# Patient Record
Sex: Female | Born: 1995 | Hispanic: Yes | Marital: Single | Smoking: Never smoker
Health system: Southern US, Community
[De-identification: ages and names within clinical notes are randomized; demographics above are authoritative.]

## PROBLEM LIST (undated history)

## (undated) ENCOUNTER — Inpatient Hospital Stay: Payer: Self-pay

## (undated) DIAGNOSIS — Z789 Other specified health status: Secondary | ICD-10-CM

## (undated) HISTORY — PX: NO PAST SURGERIES: SHX2092

## (undated) HISTORY — PX: APPENDECTOMY: SHX54

---

## 2017-12-01 NOTE — L&D Delivery Note (Signed)
Delivery Note At 8:15 AM a viable and healthy boy "Kristen Lawrence" was delivered via Vaginal, Spontaneous (Presentation:LOA ).  APGAR: 8, 9; weight  .   Placenta status: spontaneously , .  Cord: 3vC  with the following complications: nuchal x2  Anesthesia:  local Episiotomy: None Lacerations: bilateral periurethral Suture Repair: 3.0 vicryl Est. Blood Loss (mL):  200  Mom to postpartum.  Baby to Couplet care / Skin to Skin.  22yo G2P1001 at 40+2w presenting in active labor. She progressed without augmentation to fully dilated and pushed over an intact perineum. The fetal head delivered and a nuchal cord x2 was doubly clamped and cut on the perineum. The fetal body followed promptly and the baby placed on maternal abdomen, where he was promptly vigorous and crying. The peri-urethral tears were bleeding and repaired with 3-0 Vicryl, using a total of 20ml of 1% lidocaine. Her placenta delivered spontaneously and intact. Fundus firm and midline. Pt tolerated all well.  Christeen DouglasBethany Bernardine Langworthy 06/25/2018, 8:47 AM

## 2018-03-15 ENCOUNTER — Encounter: Payer: Self-pay | Admitting: Family Medicine

## 2018-04-29 ENCOUNTER — Ambulatory Visit: Payer: Self-pay | Admitting: Internal Medicine

## 2018-05-03 ENCOUNTER — Other Ambulatory Visit: Payer: Self-pay | Admitting: Physician Assistant

## 2018-05-03 DIAGNOSIS — O0930 Supervision of pregnancy with insufficient antenatal care, unspecified trimester: Secondary | ICD-10-CM

## 2018-05-14 ENCOUNTER — Encounter: Payer: Self-pay | Admitting: *Deleted

## 2018-05-14 ENCOUNTER — Inpatient Hospital Stay
Admission: RE | Admit: 2018-05-14 | Discharge: 2018-05-14 | Disposition: A | Discharge status: Home / Self Care | Payer: Self-pay | Source: Ambulatory Visit | Attending: Obstetrics and Gynecology | Admitting: Obstetrics and Gynecology

## 2018-05-14 ENCOUNTER — Ambulatory Visit
Admission: RE | Admit: 2018-05-14 | Discharge: 2018-05-14 | Disposition: A | Discharge status: Home / Self Care | Payer: Medicaid Other | Source: Ambulatory Visit | Attending: Physician Assistant | Admitting: Physician Assistant

## 2018-05-14 ENCOUNTER — Other Ambulatory Visit: Payer: Self-pay

## 2018-05-14 DIAGNOSIS — O0933 Supervision of pregnancy with insufficient antenatal care, third trimester: Secondary | ICD-10-CM | POA: Insufficient documentation

## 2018-05-14 DIAGNOSIS — O4103X9 Oligohydramnios, third trimester, other fetus: Secondary | ICD-10-CM

## 2018-05-14 DIAGNOSIS — Z3A34 34 weeks gestation of pregnancy: Secondary | ICD-10-CM | POA: Diagnosis not present

## 2018-05-14 DIAGNOSIS — O0930 Supervision of pregnancy with insufficient antenatal care, unspecified trimester: Secondary | ICD-10-CM | POA: Diagnosis present

## 2018-05-14 DIAGNOSIS — O4103X0 Oligohydramnios, third trimester, not applicable or unspecified: Secondary | ICD-10-CM | POA: Diagnosis not present

## 2018-05-14 HISTORY — DX: Other specified health status: Z78.9

## 2018-05-14 NOTE — OB Triage Note (Signed)
Low AFI per US this evening. Kristen Lawrence, Kristen Lawrence

## 2018-05-14 NOTE — Discharge Instructions (Signed)
Please call the office at [facility] to schedule a repeat ultrasound to check your baby's fluid on Tuesday. Our phone number is [phone]. If you would rather, you can return to the hospital to get an ultrasound. You will need to call [facility] to ask them to order the ultrasound on Monday. Thank you!  Llame a la oficina de [facility] para programar una repeticin de ultrasonido para verificar el lquido de su beb el [place]. Nuestro nmero de telfono es [phone]. Si lo prefiere, puede regresar al hospital para obtener un ultrasonido. Deber llamar a [facility] para pedirles que ordenen el ultrasonido el lunes. Gracias!    Evaluacin de los movimientos fetales Fetal Movement Counts Introduccin Nombre del paciente: ________________________________________________ Kristen ChapmanFecha de parto estimada: ____________________ Young BerryQu es una evaluacin de los movimientos fetales? Una evaluacin de los movimientos fetales es el registro del nmero de veces que siente que el beb se mueve durante un cierto perodo de Pasadenatiempo. Esto tambin se puede denominar recuento de patadas fetales. Una evaluacin de movimientos fetales se recomienda a todas las embarazadas. Es posible que le indiquen que comience a Development worker, communityevaluar los movimientos fetales desde la semana 28 de Joslinembarazo. Preste atencin cuando sienta que el beb est ms activo. Podr detectar los ciclos en que el beb duerme y est despierto. Tambin podr detectar que ciertas cosas hacen que su beb se mueva ms. Deber realizar una evaluacin de los movimientos fetales en las siguientes situaciones:  Cuando el beb est ms activo habitualmente.  A la Unisys Corporationmisma hora, todos los Hawleydas.  Un buen momento para evaluar los movimientos fetales es cuando est descansando, despus de haber comido y bebido algo. Cmo debo contar los movimientos fetales? 1. Encuentre un lugar tranquilo y cmodo. Sintese o acustese de lado. 2. Anote la fecha, la hora de inicio y  de finalizacin y la cantidad de movimientos que sinti entre esas dos horas. Lleve esta informacin a las visitas de control. 3. Cuente las pataditas, revoloteos, chasquidos, vueltas o pinchazos en un perodo de 2horas. Debe sentir al menos 10movimientos en 2horas. 4. Cuando sienta 10movimientos, puede dejar de contar. 5. Si no siente 10movimientos en 2horas, coma y beba algo. Luego, contine descansando y Industrial/product designercontando durante 1hora. Si siente al menos 4movimientos durante esa hora, puede dejar de contar. Comunquese con un mdico si:  Siente menos de 4movimientos en 2horas.  El beb no se mueve tanto como suele hacerlo. Fecha: ____________ Stevan BornHora de inicio: ____________ Stevan BornHora de finalizacin: ____________ Movimientos: ____________ Franco NonesFecha: ____________ Stevan BornHora de inicio: ____________ Stevan BornHora de finalizacin: ____________ Movimientos: ____________ Franco NonesFecha: ____________ Stevan BornHora de inicio: ____________ Stevan BornHora de finalizacin: ____________ Movimientos: ____________ Franco NonesFecha: ____________ Stevan BornHora de inicio: ____________ Stevan BornHora de finalizacin: ____________ Movimientos: ____________ Franco NonesFecha: ____________ Stevan BornHora de inicio: ____________ Mammie RussianHora de finalizacin: ____________ Movimientos: ____________ Franco NonesFecha: ____________ Stevan BornHora de inicio: ____________ Mammie RussianHora de finalizacin: ____________ Movimientos: ____________ Franco NonesFecha: ____________ Stevan BornHora de inicio: ____________ Mammie RussianHora de finalizacin: ____________ Movimientos: ____________ Franco NonesFecha: ____________ Stevan BornHora de inicio: ____________ Stevan BornHora de finalizacin: ____________ Movimientos: ____________ Franco NonesFecha: ____________ Stevan BornHora de inicio: ____________ Mammie RussianHora de finalizacin: ____________ Movimientos: ____________ Esta informacin no tiene como fin reemplazar el consejo del mdico. Asegrese de hacerle al mdico cualquier pregunta que tenga. Document Released: 02/24/2008 Document Revised: 02/20/2017 Document Reviewed: 12/27/2015 Elsevier Interactive Patient Education  [employer]2018 Elsevier Inc.

## 2018-05-14 NOTE — Final Progress Note (Signed)
No evidence of PPROM, reassuring fetal movements and NST. D/c home with precautions and close followup

## 2018-05-14 NOTE — Progress Notes (Signed)
TRIAGE VISIT with NST   [name] is a 22 y.o. G2P1001. She is at 4874w2d gestation. Pt of [facility], no records available. Per patient, she was sent for an ultrasound to the hospital.  Indication: Incidental finding of low fluid on ultrasound on anatomy ultrasound today at 34wks  Pt arrived from [place] and [facility]. She does not have records with her, but states an u/s was normal earlier in the pregnancy, without any pregnancy concerns. U/s today as outpatient, but AFI of 6.9 led ultrasound to send her to triage for monitoring. Labs normal with Rh positive per verbal from outpatient provider.  No leaking fluid. Good fetal movement. No contractions.  S: Resting comfortably. no CTX, no VB. Active fetal movement.  O:  BP 119/83   Pulse 75   Temp 98.8 F (37.1 C) (Oral)   Resp 16   Ht 5\' 6"  (1.676 m)   Wt 79.8 kg (176 lb)   BMI 28.41 kg/m  No results found for this or any previous visit (from the past 48 hour(s)).   Gen: NAD, AAOx3      Abd: FNTTP      Ext: Non-tender, Nonedmeatous    FHT: 135. Mod var. +accels 15x15, no decels TOCO: quiet SVE:   no fluid in the vaginal vault   A/P:  22 y.o. G2P1001 8574w2d with low AFI.   Labor: not present.   Rule out PPROM: Neg for pooling, ferning and nitrazine.  Fetal Wellbeing: NST is Reassuring Cat 1 tracing.  D/c home stable, precautions reviewed, follow-up as for repeat ultrasound in 5 days. Fetal movement counts recommended. See patient instructions.  Patient ID: [name], female   DOB: [birth year], 22 y.o.   MRN: [id number]

## 2018-05-19 ENCOUNTER — Other Ambulatory Visit: Payer: Self-pay | Admitting: Physician Assistant

## 2018-05-20 ENCOUNTER — Other Ambulatory Visit: Payer: Self-pay | Admitting: Physician Assistant

## 2018-05-20 DIAGNOSIS — Z3689 Encounter for other specified antenatal screening: Secondary | ICD-10-CM

## 2018-05-28 ENCOUNTER — Other Ambulatory Visit: Payer: Self-pay | Admitting: Physician Assistant

## 2018-05-28 DIAGNOSIS — Z3689 Encounter for other specified antenatal screening: Secondary | ICD-10-CM

## 2018-05-31 ENCOUNTER — Other Ambulatory Visit: Payer: Self-pay | Admitting: Physician Assistant

## 2018-05-31 ENCOUNTER — Ambulatory Visit
Admission: RE | Admit: 2018-05-31 | Discharge: 2018-05-31 | Disposition: A | Discharge status: Home / Self Care | Payer: Self-pay | Source: Ambulatory Visit | Attending: Physician Assistant | Admitting: Physician Assistant

## 2018-05-31 DIAGNOSIS — O288 Other abnormal findings on antenatal screening of mother: Secondary | ICD-10-CM | POA: Insufficient documentation

## 2018-05-31 DIAGNOSIS — Z3A35 35 weeks gestation of pregnancy: Secondary | ICD-10-CM | POA: Insufficient documentation

## 2018-05-31 DIAGNOSIS — O4103X0 Oligohydramnios, third trimester, not applicable or unspecified: Secondary | ICD-10-CM | POA: Insufficient documentation

## 2018-06-07 ENCOUNTER — Other Ambulatory Visit: Payer: Self-pay | Admitting: Physician Assistant

## 2018-06-07 ENCOUNTER — Ambulatory Visit: Payer: Self-pay

## 2018-06-07 DIAGNOSIS — Z3493 Encounter for supervision of normal pregnancy, unspecified, third trimester: Secondary | ICD-10-CM

## 2018-06-07 DIAGNOSIS — O4100X0 Oligohydramnios, unspecified trimester, not applicable or unspecified: Secondary | ICD-10-CM

## 2018-06-09 ENCOUNTER — Ambulatory Visit
Admission: RE | Admit: 2018-06-09 | Discharge: 2018-06-09 | Disposition: A | Discharge status: Home / Self Care | Payer: Self-pay | Source: Ambulatory Visit | Attending: Physician Assistant | Admitting: Physician Assistant

## 2018-06-09 DIAGNOSIS — O4100X0 Oligohydramnios, unspecified trimester, not applicable or unspecified: Secondary | ICD-10-CM

## 2018-06-09 DIAGNOSIS — O4103X0 Oligohydramnios, third trimester, not applicable or unspecified: Secondary | ICD-10-CM | POA: Insufficient documentation

## 2018-06-09 DIAGNOSIS — Z3A37 37 weeks gestation of pregnancy: Secondary | ICD-10-CM | POA: Insufficient documentation

## 2018-06-09 DIAGNOSIS — Z3493 Encounter for supervision of normal pregnancy, unspecified, third trimester: Secondary | ICD-10-CM

## 2018-06-10 ENCOUNTER — Other Ambulatory Visit: Payer: Self-pay | Admitting: Family Medicine

## 2018-06-10 DIAGNOSIS — O4100X0 Oligohydramnios, unspecified trimester, not applicable or unspecified: Secondary | ICD-10-CM

## 2018-06-16 ENCOUNTER — Other Ambulatory Visit: Payer: Self-pay | Admitting: Family Medicine

## 2018-06-16 ENCOUNTER — Ambulatory Visit
Admission: RE | Admit: 2018-06-16 | Discharge: 2018-06-16 | Disposition: A | Discharge status: Home / Self Care | Payer: Self-pay | Source: Ambulatory Visit | Attending: Family Medicine | Admitting: Family Medicine

## 2018-06-16 DIAGNOSIS — O288 Other abnormal findings on antenatal screening of mother: Secondary | ICD-10-CM

## 2018-06-16 DIAGNOSIS — O4100X0 Oligohydramnios, unspecified trimester, not applicable or unspecified: Secondary | ICD-10-CM

## 2018-06-16 DIAGNOSIS — O4103X0 Oligohydramnios, third trimester, not applicable or unspecified: Secondary | ICD-10-CM | POA: Insufficient documentation

## 2018-06-22 ENCOUNTER — Other Ambulatory Visit: Payer: Self-pay | Admitting: Physician Assistant

## 2018-06-22 DIAGNOSIS — O4193X0 Disorder of amniotic fluid and membranes, unspecified, third trimester, not applicable or unspecified: Secondary | ICD-10-CM

## 2018-06-25 ENCOUNTER — Other Ambulatory Visit: Payer: Self-pay

## 2018-06-25 ENCOUNTER — Ambulatory Visit
Admission: RE | Admit: 2018-06-25 | Discharge: 2018-06-25 | Disposition: A | Discharge status: Home / Self Care | Payer: Self-pay | Source: Ambulatory Visit | Attending: Physician Assistant | Admitting: Physician Assistant

## 2018-06-25 ENCOUNTER — Inpatient Hospital Stay
Admission: EM | Admit: 2018-06-25 | Discharge: 2018-06-26 | DRG: 806 | Disposition: A | Discharge status: Home / Self Care | Payer: Medicaid Other | Attending: Obstetrics and Gynecology | Admitting: Obstetrics and Gynecology

## 2018-06-25 ENCOUNTER — Encounter: Payer: Self-pay | Admitting: Certified Nurse Midwife

## 2018-06-25 DIAGNOSIS — O99824 Streptococcus B carrier state complicating childbirth: Secondary | ICD-10-CM | POA: Diagnosis present

## 2018-06-25 DIAGNOSIS — Z3A40 40 weeks gestation of pregnancy: Secondary | ICD-10-CM | POA: Diagnosis not present

## 2018-06-25 DIAGNOSIS — Z6791 Unspecified blood type, Rh negative: Secondary | ICD-10-CM | POA: Diagnosis not present

## 2018-06-25 DIAGNOSIS — O4103X0 Oligohydramnios, third trimester, not applicable or unspecified: Secondary | ICD-10-CM | POA: Diagnosis present

## 2018-06-25 DIAGNOSIS — Z3483 Encounter for supervision of other normal pregnancy, third trimester: Secondary | ICD-10-CM | POA: Diagnosis present

## 2018-06-25 DIAGNOSIS — O26893 Other specified pregnancy related conditions, third trimester: Secondary | ICD-10-CM | POA: Diagnosis present

## 2018-06-25 DIAGNOSIS — Z3490 Encounter for supervision of normal pregnancy, unspecified, unspecified trimester: Secondary | ICD-10-CM

## 2018-06-25 LAB — TYPE AND SCREEN
ABO/RH(D): O NEG
Antibody Screen: POSITIVE
UNIT DIVISION: 0
Unit division: 0

## 2018-06-25 LAB — CBC
HCT: 39.2 % (ref 35.0–47.0)
Hemoglobin: 13.2 g/dL (ref 12.0–16.0)
MCH: 32 pg (ref 26.0–34.0)
MCHC: 33.7 g/dL (ref 32.0–36.0)
MCV: 94.9 fL (ref 80.0–100.0)
PLATELETS: 129 10*3/uL — AB (ref 150–440)
RBC: 4.13 MIL/uL (ref 3.80–5.20)
RDW: 14 % (ref 11.5–14.5)
WBC: 12.9 10*3/uL — AB (ref 3.6–11.0)

## 2018-06-25 LAB — BPAM RBC
Blood Product Expiration Date: 201908122359
Blood Product Expiration Date: 201908122359
UNIT TYPE AND RH: 9500
Unit Type and Rh: 9500

## 2018-06-25 LAB — PROTEIN / CREATININE RATIO, URINE
Creatinine, Urine: 51 mg/dL
PROTEIN CREATININE RATIO: 1.14 mg/mg{creat} — AB (ref 0.00–0.15)
Total Protein, Urine: 58 mg/dL

## 2018-06-25 LAB — COMPREHENSIVE METABOLIC PANEL
ALT: 11 U/L (ref 0–44)
AST: 18 U/L (ref 15–41)
Albumin: 3.2 g/dL — ABNORMAL LOW (ref 3.5–5.0)
Alkaline Phosphatase: 196 U/L — ABNORMAL HIGH (ref 38–126)
Anion gap: 9 (ref 5–15)
BUN: 11 mg/dL (ref 6–20)
CO2: 20 mmol/L — ABNORMAL LOW (ref 22–32)
Calcium: 9.1 mg/dL (ref 8.9–10.3)
Chloride: 110 mmol/L (ref 98–111)
Creatinine, Ser: 0.62 mg/dL (ref 0.44–1.00)
GFR calc Af Amer: >60 mL/min (ref >60)
GFR calc non Af Amer: >60 mL/min (ref >60)
GLUCOSE: 76 mg/dL (ref 70–99)
Potassium: 3.9 mmol/L (ref 3.5–5.1)
Sodium: 139 mmol/L (ref 135–145)
Total Bilirubin: 0.6 mg/dL (ref 0.3–1.2)
Total Protein: 6.7 g/dL (ref 6.5–8.1)

## 2018-06-25 MED ORDER — PRENATAL MULTIVITAMIN CH
1.0000 | ORAL_TABLET | Freq: Every day | ORAL | Status: DC
  Administered 2018-06-26: 1 via ORAL
  Filled 2018-06-25: qty 1

## 2018-06-25 MED ORDER — SODIUM CHLORIDE 0.9% FLUSH: 3.0000 mL | INTRAVENOUS | Status: DC | PRN

## 2018-06-25 MED ORDER — FLEET ENEMA 7-19 GM/118ML RE ENEM: 1.0000 | ENEMA | Freq: Every day | RECTAL | Status: DC | PRN

## 2018-06-25 MED ORDER — OXYTOCIN 10 UNIT/ML IJ SOLN
INTRAMUSCULAR | Status: AC
  Filled 2018-06-25: qty 2

## 2018-06-25 MED ORDER — SOD CITRATE-CITRIC ACID 500-334 MG/5ML PO SOLN: 30.0000 mL | ORAL | Status: DC | PRN

## 2018-06-25 MED ORDER — BUTORPHANOL TARTRATE 1 MG/ML IJ SOLN
1.0000 mg | INTRAMUSCULAR | Status: DC | PRN
  Administered 2018-06-25: 1 mg via INTRAVENOUS

## 2018-06-25 MED ORDER — IBUPROFEN 600 MG PO TABS
600.0000 mg | ORAL_TABLET | Freq: Four times a day (QID) | ORAL | Status: DC
  Administered 2018-06-25 – 2018-06-26 (×3): 600 mg via ORAL
  Filled 2018-06-25 (×4): qty 1

## 2018-06-25 MED ORDER — ACETAMINOPHEN 325 MG PO TABS
650.0000 mg | ORAL_TABLET | ORAL | Status: DC | PRN
Start: 2018-06-25 — End: 2018-06-26
  Administered 2018-06-26: 650 mg via ORAL
  Filled 2018-06-25: qty 2

## 2018-06-25 MED ORDER — LIDOCAINE HCL (PF) 1 % IJ SOLN
INTRAMUSCULAR | Status: AC
  Filled 2018-06-25: qty 30

## 2018-06-25 MED ORDER — SIMETHICONE 80 MG PO CHEW
80.0000 mg | CHEWABLE_TABLET | ORAL | Status: DC | PRN
Start: 2018-06-25 — End: 2018-06-26

## 2018-06-25 MED ORDER — BISACODYL 10 MG RE SUPP: 10.0000 mg | Freq: Every day | RECTAL | Status: DC | PRN

## 2018-06-25 MED ORDER — COCONUT OIL OIL
1.0000 | TOPICAL_OIL | Status: DC | PRN
Start: 2018-06-25 — End: 2018-06-26

## 2018-06-25 MED ORDER — ONDANSETRON HCL 4 MG PO TABS: 4.0000 mg | ORAL_TABLET | ORAL | Status: DC | PRN

## 2018-06-25 MED ORDER — SODIUM CHLORIDE 0.9 % IV SOLN: 250.0000 mL | INTRAVENOUS | Status: DC | PRN

## 2018-06-25 MED ORDER — AMMONIA AROMATIC IN INHA
RESPIRATORY_TRACT | Status: AC
  Filled 2018-06-25: qty 10

## 2018-06-25 MED ORDER — ONDANSETRON HCL 4 MG/2ML IJ SOLN: 4.0000 mg | INTRAMUSCULAR | Status: DC | PRN

## 2018-06-25 MED ORDER — LIDOCAINE HCL (PF) 1 % IJ SOLN
30.0000 mL | INTRAMUSCULAR | Status: DC | PRN
  Administered 2018-06-25: 30 mL via SUBCUTANEOUS

## 2018-06-25 MED ORDER — SODIUM CHLORIDE 0.9% FLUSH
3.0000 mL | Freq: Two times a day (BID) | INTRAVENOUS | Status: DC
  Administered 2018-06-26: 3 mL via INTRAVENOUS

## 2018-06-25 MED ORDER — SODIUM CHLORIDE 0.9 % IV SOLN
2.0000 g | Freq: Once | INTRAVENOUS | Status: AC
  Administered 2018-06-25: 2 g via INTRAVENOUS
  Filled 2018-06-25: qty 2000

## 2018-06-25 MED ORDER — BENZOCAINE-MENTHOL 20-0.5 % EX AERO: 1.0000 "application " | INHALATION_SPRAY | CUTANEOUS | Status: DC | PRN

## 2018-06-25 MED ORDER — MEASLES, MUMPS & RUBELLA VAC ~~LOC~~ INJ
0.5000 mL | INJECTION | Freq: Once | SUBCUTANEOUS | Status: DC
  Filled 2018-06-25: qty 0.5

## 2018-06-25 MED ORDER — OXYTOCIN 40 UNITS IN LACTATED RINGERS INFUSION - SIMPLE MED
2.5000 [IU]/h | INTRAVENOUS | Status: DC
  Administered 2018-06-25: 2.5 [IU]/h via INTRAVENOUS
  Filled 2018-06-25: qty 1000

## 2018-06-25 MED ORDER — DIBUCAINE 1 % RE OINT: 1.0000 "application " | TOPICAL_OINTMENT | RECTAL | Status: DC | PRN

## 2018-06-25 MED ORDER — SENNOSIDES-DOCUSATE SODIUM 8.6-50 MG PO TABS
2.0000 | ORAL_TABLET | ORAL | Status: DC
  Administered 2018-06-25: 2 via ORAL
  Filled 2018-06-25: qty 2

## 2018-06-25 MED ORDER — SODIUM CHLORIDE 0.9 % IV SOLN
1.0000 g | INTRAVENOUS | Status: DC
  Filled 2018-06-25 (×6): qty 1000

## 2018-06-25 MED ORDER — ACETAMINOPHEN 325 MG PO TABS: 650.0000 mg | ORAL_TABLET | ORAL | Status: DC | PRN

## 2018-06-25 MED ORDER — TETANUS-DIPHTH-ACELL PERTUSSIS 5-2.5-18.5 LF-MCG/0.5 IM SUSP: 0.5000 mL | Freq: Once | INTRAMUSCULAR | Status: DC

## 2018-06-25 MED ORDER — DIPHENHYDRAMINE HCL 25 MG PO CAPS: 25.0000 mg | ORAL_CAPSULE | Freq: Four times a day (QID) | ORAL | Status: DC | PRN

## 2018-06-25 MED ORDER — WITCH HAZEL-GLYCERIN EX PADS: 1.0000 "application " | MEDICATED_PAD | CUTANEOUS | Status: DC | PRN

## 2018-06-25 MED ORDER — OXYTOCIN BOLUS FROM INFUSION
500.0000 mL | Freq: Once | INTRAVENOUS | Status: AC
  Administered 2018-06-25: 500 mL via INTRAVENOUS

## 2018-06-25 MED ORDER — OXYCODONE HCL 5 MG PO TABS: 5.0000 mg | ORAL_TABLET | ORAL | Status: DC | PRN

## 2018-06-25 MED ORDER — LACTATED RINGERS IV SOLN
INTRAVENOUS | Status: DC
  Administered 2018-06-25 (×2): via INTRAVENOUS

## 2018-06-25 MED ORDER — ONDANSETRON HCL 4 MG/2ML IJ SOLN: 4.0000 mg | Freq: Four times a day (QID) | INTRAMUSCULAR | Status: DC | PRN

## 2018-06-25 MED ORDER — ZOLPIDEM TARTRATE 5 MG PO TABS: 5.0000 mg | ORAL_TABLET | Freq: Every evening | ORAL | Status: DC | PRN

## 2018-06-25 MED ORDER — BUTORPHANOL TARTRATE 2 MG/ML IJ SOLN
INTRAMUSCULAR | Status: AC
Start: 2018-06-25 — End: 2018-06-25
  Administered 2018-06-25: 1 mg via INTRAVENOUS
  Filled 2018-06-25: qty 1

## 2018-06-25 MED ORDER — LACTATED RINGERS IV SOLN: 500.0000 mL | INTRAVENOUS | Status: DC | PRN

## 2018-06-25 NOTE — H&P (Signed)
OB ADMISSION/ HISTORY & PHYSICAL:  Admission Date: 06/25/2018 12:11 AM  Admit Diagnosis: Active labor  Kristen Lawrence is a 22 y.o. female presenting for active labor.  Prenatal History: G2P1001   EDC : 06/23/2018, by Ultrasound  Prenatal care at [facility] Prenatal course complicated by  - GBS pos Rh neg Incomplete prenatal care [place] in this pregnancy, normal fetal surveillance  Medical / Surgical History :  Past medical history:  Past Medical History:  Diagnosis Date  . Medical history non-contributory      Past surgical history:  Past Surgical History:  Procedure Laterality Date  . NO PAST SURGERIES      Family History: History reviewed. No pertinent family history.   Social History:  reports that she has never smoked. She has never used smokeless tobacco. She reports that she does not drink alcohol or use drugs.   Allergies: Patient has no known allergies.    Current Medications at time of admission:  Prior to Admission medications   Medication Sig Start Date End Date Taking? Authorizing Provider  Prenatal Vit-Fe Fumarate-FA (PRENATAL MULTIVITAMIN) TABS tablet Take 1 tablet by mouth daily at 12 noon.   Yes [provider]     Review of Systems: Active FM LOF  / SROM: no bloody show yes   Physical Exam:  VS: Blood pressure 140/81, pulse 75, resp. rate 18, height 5\' 6"  (1.676 m), weight 84.4 kg (186 lb).  General: alert and oriented, appears NAD Heart: RRR Lungs: Clear lung fields Abdomen: Gravid, soft and non-tender, non-distended / uterus: non tender Extremities: trace edema  FHT: 120, moderate variability, +accels, no decels TOCO:q2-3 min SVE:  Dilation: 6.5 / Effacement (%): 80, 90 / Station: -2    Cephalic by leopolds  Prenatal Labs: Blood type/Rh --/--/PENDING (07/26 0414)  Antibody screen neg  Rubella Immune  Varicella Immune  RPR NR  HBsAg Neg  HIV NR  GC neg  Chlamydia neg  Genetic screening negative  1  hour GTT 83  3 hour GTT n/a  GBS positive   US Ob Limited  Result Date: 06/17/2018 CLINICAL DATA:  Third trimester pregnancy with oligohydramnios. EXAM: LIMITED OBSTETRIC ULTRASOUND COMPARISON:  06/09/2018 FINDINGS: Number of Fetuses: 1 Heart Rate:  153 bpm Movement: Yes Presentation: Cephalic Placental Location: Posterior Previa: No Amniotic Fluid (Subjective):  Borderline decreased AFI: 7.6 cm MATERNAL FINDINGS: Cervix:  Appears closed. IMPRESSION: Borderline decreased amniotic fluid volume, with AFI measuring 7.6 cm. No significant change noted compared to prior study. This exam is performed on an emergent basis and does not comprehensively evaluate fetal size, dating, or anatomy; follow-up complete OB US should be considered if further fetal assessment is warranted. Electronically Signed   By: Myles Rosenthal M.D.   On: 06/17/2018 08:09   US Ob Limited  Result Date: 06/10/2018 CLINICAL DATA:  Followup oligohydramnios. Third trimester pregnancy. EXAM: LIMITED OBSTETRIC ULTRASOUND COMPARISON: 05/31/2018 FINDINGS: Number of Fetuses: 1 Heart Rate:  143 bpm Movement: Yes Presentation: Cephalic Placental Location: Posterior Previa: No Amniotic Fluid (Subjective): Borderline decreased, without significant change subjectively compared to previous study AFI: 7.4 cm (5th %ile = 7.3 cm for 38 weeks) BPD: 9.1 cm 37 w  0 d MATERNAL FINDINGS: Cervix:  Not evaluated (>34 wks) Uterus/Adnexae: No abnormality visualized. IMPRESSION: Single living intrauterine fetus in cephalic presentation. Borderline decreased amniotic fluid volume, with AFI of 7.4 cm, without significant change since prior study on 05/31/2018. Electronically Signed   By: Myles Rosenthal M.D.   On: 06/10/2018  08:17   [place] Ob Limited  Result Date: 06/01/2018 CLINICAL DATA:  Oligohydramnios. EXAM: LIMITED OBSTETRIC ULTRASOUND FINDINGS: Number of Fetuses: 1 Heart Rate:  144 bpm Movement: Present Presentation: Cephalic Placental Location: Posterior Previa: None  Amniotic Fluid (Subjective):  Decreased AFI: 6.2 cm which is below the fifth percentile. BPD: 8.8 cm 35 w  5 d MATERNAL FINDINGS: Cervix:  Appears closed. Uterus/Adnexae: No abnormality visualized. IMPRESSION: Oligohydramnios with an amniotic fluid index of 6.2 cm which is down from 6.9 cm on the study of May 14, 2018. These values are below the fifth percentile. Viable IUP with estimated age of 35 weeks 5 days This exam is performed on an emergent basis and does not comprehensively evaluate fetal size, dating, or anatomy; follow-up complete OB US should be considered if further fetal assessment is warranted. Electronically Signed   By: David  SwazilandJordan M.D.   On: 06/01/2018 07:58    Assessment: 40+[redacted] weeks gestation 1 stage of labor FHR category 1 Earlier prenatal care in [place], prior baby born there as well.  Plan:   Admit for active labor Labs pending Epidural when desired Continuous fetal monitoring   1. Fetal Well being  - Fetal Tracing: Cat I  - Ultrasound:  reviewed, as above - Group B Streptococcus: positive- start amp - Presentation: vtx confirmed by Leopolds   2. Routine OB: - Prenatal labs reviewed, as above - Rh O neg  3. Post Partum Planning: - Infant feeding: breast - Contraception: undecided

## 2018-06-25 NOTE — Lactation Note (Signed)
This note was copied from a baby's chart. Lactation Consultation Note  Patient Name: Boy Ritta SlotSara Munoz Morales Today's Date: 06/25/2018 Reason for consult: Term;Initial assessment Assisted mom with latching Freida BusmanAllen to right breast after mom had just breast fed on left breast.  Mom has firm flattish nipples, but can compress to get him to latch. Can express drops of colostrum. Once he feels breast deep enough in his mouth, he pulls it in and begins good rhythmic sucking with occasional swallows.  Mom reports through interpreter that she had same problem with getting good latch with 22 year old, but once she figured it out she went on to breast feed for 1 year but did give some bottles of formula also.  Discussed supply and demand, normal course of lactation and routine newborn feeding patterns.  Lactation name and number were written on white board earlier and encouraged to call with any questions, concerns or assistance.  Maternal Data Formula Feeding for Exclusion: No Has patient been taught Hand Expression?: Yes Does the patient have breastfeeding experience prior to this delivery?: Yes  Feeding Feeding Type: Breast Fed Length of feed: 8 min  LATCH Score Latch: Repeated attempts needed to sustain latch, nipple held in mouth throughout feeding, stimulation needed to elicit sucking reflex.  Audible Swallowing: A few with stimulation  Type of Nipple: Flat  Comfort (Breast/Nipple): Soft / non-tender  Hold (Positioning): Assistance needed to correctly position infant at breast and maintain latch.  LATCH Score: 6  Interventions Interventions: Breast feeding basics reviewed;Reverse pressure;Assisted with latch;Breast compression;Adjust position;Breast massage;Support pillows;Position options  Lactation Tools Discussed/Used WIC Program: Yes   Consult Status Consult Status: PRN    Louis MeckelWilliams, Kristion Holifield Kay 06/25/2018, 8:13 PM

## 2018-06-25 NOTE — Discharge Summary (Addendum)
Obstetrical Discharge Summary  Patient Name: Kristen Lawrence DOB: 1996/11/13 MRN: [id number]  Date of Admission: 06/25/2018 Date of Discharge: 06/26/2018  [facility]   Gestational Age at Delivery: 2223w2d   Antepartum complications:  GBS pos Rh neg Incomplete prenatal care [place] in this pregnancy, normal fetal surveillance   Admitting Diagnosis: Active labor Secondary Diagnosis:Rh neg Patient Active Problem List   Diagnosis Date Noted  . Pregnancy 06/25/2018    Augmentation: none Complications: None Intrapartum complications/course:  22yo G2P1001 at 40+2w presenting in active labor. She progressed without augmentation to fully dilated and pushed over an intact perineum. The fetal head delivered and a nuchal cord x2 was doubly clamped and cut on the perineum. The fetal body followed promptly and the baby placed on maternal abdomen, where he was promptly vigorous and crying. The peri-urethral tears were bleeding and repaired with 3-0 Vicryl, using a total of 20ml of 1% lidocaine. Her placenta delivered spontaneously and intact. Fundus firm and midline. Pt tolerated all well.  Date of Delivery: 06/25/18 Delivered By: Christeen DouglasBethany Beasley  Delivery Type: spontaneous vaginal delivery Anesthesia: local Placenta: Spontaneous Laceration: periurethral Episiotomy: none Newborn Data: Live born boy "Freida Busmanllen" Birth Weight:  3330g 7lb 5.5oz APGAR: 8, 9  Newborn Delivery   Birth date/time:  06/25/2018 08:15:00 Delivery type:  Vaginal, Spontaneous     Discharge Physical Exam:  BP (!) 132/95 (BP Location: Left Arm) Comment: nurse Maureen RalphsSusan Hedrick notified  Pulse 60   Temp 98.8 F (37.1 C) (Oral)   Resp 18   Ht 5\' 6"  (1.676 m)   Wt 84.4 kg (186 lb)   SpO2 100%   BMI 30.02 kg/m   General: NAD CV: RRR Pulm: CTABL, nl effort ABD: s/nd/nt, fundus firm and below the umbilicus Lochia: moderate DVT Evaluation: LE non-ttp, no evidence of DVT on exam.  Hemoglobin   Date Value Ref Range Status  06/26/2018 11.0 (L) 12.0 - 16.0 g/dL Final   HCT  Date Value Ref Range Status  06/26/2018 32.5 (L) 35.0 - 47.0 % Final    Post partum course: Patient had an uncomplicated postpartum course.  By time of discharge on PPD#1, her pain was controlled on oral pain medications; she had appropriate lochia and was ambulating, voiding without difficulty and tolerating regular diet. She had intermittent mildly elevated BPs, but normal labs and no other signs or symptoms of preeclampsia. She was deemed stable for discharge to home.  Postpartum Procedures: none Disposition: stable, discharge to home Baby Feeding: breastmilk Baby Disposition: home with mom  Rh Immune globulin given: ordered to be given prior to discharge Rubella vaccine given: n/a Tdap vaccine given in AP or PP setting: 04/30/2018 Flu vaccine given in AP or PP setting: n/a  Contraception: undecided, thinking about IUD  Prenatal Labs: Blood type/Rh --/--/O NEG (07/26 0509)  Antibody screen neg  Rubella Immune  Varicella Immune  RPR NR  HBsAg Neg  HIV NR  GC neg  Chlamydia neg  Genetic screening negative  1 hour GTT 83  3 hour GTT n/a  GBS positive    Plan:  Kristen Lawrence was discharged to home in good condition. [facility] with delivering provider in 6 weeks. Follow-up for blood pressure check in 1 week.    Discharge Medications: Allergies as of 06/26/2018   No Known Allergies     Medication List    TAKE these medications   acetaminophen 325 MG tablet Commonly known as:  TYLENOL Take  2 tablets (650 mg total) by mouth every 4 (four) hours as needed for mild pain or moderate pain.   ibuprofen 600 MG tablet Commonly known as:  ADVIL,MOTRIN Take 1 tablet (600 mg total) by mouth every 6 (six) hours as needed for mild pain, moderate pain or cramping.   prenatal multivitamin Tabs tablet Take 1 tablet by mouth daily at 12 noon.       Follow-up Information    Christeen Douglas, MD. Schedule an appointment as soon as possible for a visit in 6 week(s).   Specialty:  Obstetrics and Gynecology Why:  For routine postpartum visit Contact information: [address] [place] [address] [phone]        [facility]. Schedule an appointment as soon as possible for a visit in 1 week(s).   Why:  For blood pressure check Contact information: [address] [place] [address] [phone]          Signed: Genia Del, CNM 06/26/2018 9:53 AM

## 2018-06-25 NOTE — OB Triage Note (Signed)
Pt states she has been having ctxs since 5AM. Pt states the ctxs have been 3-5 minutes apart since 10PM. Pt states she has had some bloody show. Pt denies LOF and states + FM.

## 2018-06-26 ENCOUNTER — Encounter: Payer: Self-pay | Admitting: Lactation Services

## 2018-06-26 LAB — CBC
HEMATOCRIT: 32.5 % — AB (ref 35.0–47.0)
Hemoglobin: 11 g/dL — ABNORMAL LOW (ref 12.0–16.0)
MCH: 32.6 pg (ref 26.0–34.0)
MCHC: 34 g/dL (ref 32.0–36.0)
MCV: 95.7 fL (ref 80.0–100.0)
Platelets: 113 10*3/uL — ABNORMAL LOW (ref 150–440)
RBC: 3.39 MIL/uL — ABNORMAL LOW (ref 3.80–5.20)
RDW: 14.1 % (ref 11.5–14.5)
WBC: 12 10*3/uL — ABNORMAL HIGH (ref 3.6–11.0)

## 2018-06-26 LAB — COMPREHENSIVE METABOLIC PANEL
ALT: 13 U/L (ref 0–44)
AST: 25 U/L (ref 15–41)
Albumin: 2.6 g/dL — ABNORMAL LOW (ref 3.5–5.0)
Alkaline Phosphatase: 136 U/L — ABNORMAL HIGH (ref 38–126)
Anion gap: 7 (ref 5–15)
BILIRUBIN TOTAL: 0.5 mg/dL (ref 0.3–1.2)
BUN: 12 mg/dL (ref 6–20)
CO2: 26 mmol/L (ref 22–32)
Calcium: 8.4 mg/dL — ABNORMAL LOW (ref 8.9–10.3)
Chloride: 107 mmol/L (ref 98–111)
Creatinine, Ser: 0.8 mg/dL (ref 0.44–1.00)
Glucose, Bld: 77 mg/dL (ref 70–99)
POTASSIUM: 4.2 mmol/L (ref 3.5–5.1)
Sodium: 140 mmol/L (ref 135–145)
TOTAL PROTEIN: 5.6 g/dL — AB (ref 6.5–8.1)

## 2018-06-26 LAB — FETAL SCREEN: FETAL SCREEN: NEGATIVE

## 2018-06-26 MED ORDER — RHO D IMMUNE GLOBULIN 1500 UNIT/2ML IJ SOSY
300.0000 ug | PREFILLED_SYRINGE | Freq: Once | INTRAMUSCULAR | Status: AC
  Administered 2018-06-26: 300 ug via INTRAMUSCULAR
  Filled 2018-06-26: qty 2

## 2018-06-26 MED ORDER — IBUPROFEN 600 MG PO TABS: 600.0000 mg | ORAL_TABLET | Freq: Four times a day (QID) | ORAL | 0 refills | Status: DC | PRN

## 2018-06-26 MED ORDER — ACETAMINOPHEN 325 MG PO TABS: 650.0000 mg | ORAL_TABLET | ORAL | 0 refills | Status: AC | PRN

## 2018-06-26 NOTE — Progress Notes (Signed)
Discharge instructions provided.  Pt and sig other verbalize understanding of all instructions and follow-up care.  Pt discharged to home with infant at 1410 on 06/26/18 via wheelchair by RN. Reynold BowenSusan Paisley Viha Kriegel, RN 06/26/2018 4:34 PM

## 2018-06-26 NOTE — Discharge Instructions (Signed)
Please call [phone] [clinic M-F 8:30a-4:30p] or [phone] [L&D] if you have any of the following symptoms: -Swelling of the face or hands -Headache that will not go away -Blurry vision, white or black spots in your vision, bright lights/stars in your vision -Pain on the right, upper side of your abdomen that aches and is not from the baby's feet kicking you -Pain in the upper, middle of your abdomen, directly under your ribcage -Nausea or vomiting/unable to keep food or liquids down -Difficulty breathing    After Your Delivery Discharge Instructions   Postpartum: Care Instructions  After childbirth (postpartum period), your body goes through many changes. Some of these changes happen over several weeks. In the hours after delivery, your body will begin to recover from childbirth while it prepares to breastfeed your newborn. You may feel emotional during this time. Your hormones can shift your mood without warning for no clear reason.  In the first couple of weeks after childbirth, many women have emotions that change from happy to sad. You may find it hard to sleep. You may cry a lot. This is called the "baby blues." These overwhelming emotions often go away within a couple of days or weeks. But it's important to discuss your feelings with your doctor.  You should call your care provider if you have unrelieved feelings of:  Inability to cope  Sadness  Anxiety  Lack of interest in baby  Insomnia  Crying  It is easy to get too tired and overwhelmed during the first weeks after childbirth. Don't try to do too much. Get rest whenever you can, accept help from others, and eat well and drink plenty of fluids.  About 4 to 6 weeks after your baby's birth, you will have a follow-up visit with your care provider. This visit is your time to talk to your provider about anything you are concerned or curious about.  Follow-up care is a key part of your treatment and safety. Be sure to  make and go to all appointments, and call your doctor if you are having problems. It's also a good idea to know your test results and keep a list of the medicines you take.  How can you care for yourself at home?  Sleep or rest when your baby sleeps.  Get help with household chores from family or friends, if you can. Do not try to do it all yourself.  If you have hemorrhoids or swelling or pain around the opening of your vagina, try using cold and heat. You can put ice or a cold pack on the area for 10 to 20 minutes at a time. Put a thin cloth between the ice and your skin. Also try sitting in a few inches of warm water (sitz bath) 3 times a day and after bowel movements.  Take pain medicines exactly as directed.  If the provider gave you a prescription medicine for pain, take it as prescribed.  If you do not have a prescription and need something over the counter, you can take:  Ibuprofen (Motrin, Advil) up to 600mg  every 6 hours as needed for pain  Acetaminophen (Tylenol) up to 650mg  every 4 hours as needed for pain  Some people find it helpful to alternate between these two medications.   No driving for 1-2 weeks or while taking pain medications.   Eat more fiber to avoid constipation. Include foods such as whole-grain breads and cereals, raw vegetables, raw and dried fruits, and beans.  Drink plenty of  fluids, enough so that your urine is light yellow or clear like water. If you have kidney, heart, or liver disease and have to limit fluids, talk with your doctor before you increase the amount of fluids you drink.  Do not put anything in the vagina for 6 weeks. This means no sex, no tampons, no douching, and no enemas.  If you have stitches, keep the area clean by pouring or spraying warm water over the area outside your vagina and anus after you use the toilet.  No strenuous activity or heavy lifting for 6 weeks   No tub baths; showers only  Continue prenatal vitamin and  iron.  If breastfeeding:  Increase calories and fluids while breastfeeding.  You may have a slight fever when your milk comes in, but it should go away on its own. If it does not, and rises above 101.0 please call the doctor.  For breastfeeding concerns, the lactation consultant can be reached at [phone].  For concerns about your baby, please call your pediatrician.   Keep a list of questions to bring to your postpartum visit. Your questions might be about:  Changes in your breasts, such as lumps or soreness.  When to expect your menstrual period to start again.  What form of birth control is best for you.  Weight you have put on during the pregnancy.  Exercise options.  What foods and drinks are best for you, especially if you are breastfeeding.  Problems you might be having with breastfeeding.  When you can have sex. Some women may want to talk about lubricants for the vagina.  Any feelings of sadness or restlessness that you are having.   When should you call for help?  Call 911 anytime you think you may need emergency care. For example, call if:  You have thoughts of harming yourself, your baby, or another person.  You passed out (lost consciousness).  Call the office at [phone] or seek immediate medical care if:  If you have heavy bleeding such that you are soaking 1 pad in an hour for 2 hours  You are dizzy or lightheaded, or you feel like you may faint.  You have a fever; a temperature of 101.0 F or greater  Chills  Difficulty urinating  Headache unrelieved by "pain meds"   Visual changes  Pain in the right side of your belly near your ribs  Breasts reddened, hard, hot to the touch or any other breast concerns  Nipple discharge which is foul-smelling or contains pus   Increased pain at the site of the tear   New pain unrelieved with recommended over-the-counter dosages  Difficulty breathing with or without chest pain   New leg  pain, swelling, or redness, especially if it is only on one leg  Any other concerns  Watch closely for changes in your health, and be sure to contact your provider if:  You have new or worse vaginal discharge.  You feel sad or depressed.  You are having problems with your breasts or breastfeeding.   Call your doctor for increased pain or vaginal bleeding, temperature above 100.4, depression, or concerns.  Increase calories and fluids while breastfeeding. Continue prenatal vitamin and iron.  No strenuous activity or heavy lifting for 6 weeks.  No intercourse, tampons, or douching for 6 weeks. No tub baths- showers only.  No driving for 2 weeks.

## 2018-06-26 NOTE — Lactation Note (Signed)
This note was copied from a baby's chart. Lactation Consultation Note  Patient Name: Kristen Lawrence [id number]'YToday's Date: 06/26/2018  Observed mom latch Kristen Lawrence independently.  Mom denies any problems or pain.   Maternal Data    Feeding    LATCH Score                   Interventions    Lactation Tools Discussed/Used     Consult Status      Louis MeckelWilliams, Vandy Fong Kay 06/26/2018, 12:54 PM

## 2018-06-27 LAB — RHOGAM INJECTION: Unit division: 0

## 2018-06-27 LAB — RPR: RPR Ser Ql: NONREACTIVE

## 2020-02-14 IMAGING — US US OB LIMITED
1 series · 14 of 15 positions shown · non-contrast
Comparison: none

CLINICAL DATA: Oligohydramnios.

EXAM:
LIMITED OBSTETRIC ULTRASOUND

[Series 1: us ob limited · 14 of 15 slices shown]
[im 1/15]
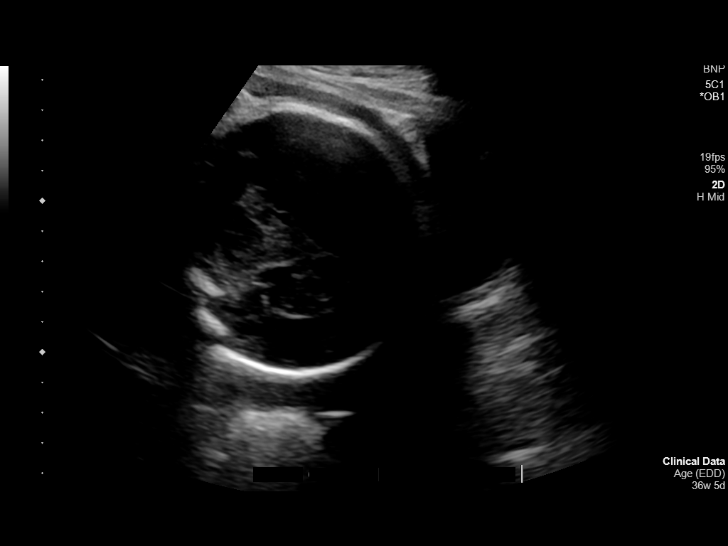
[im 2/15]
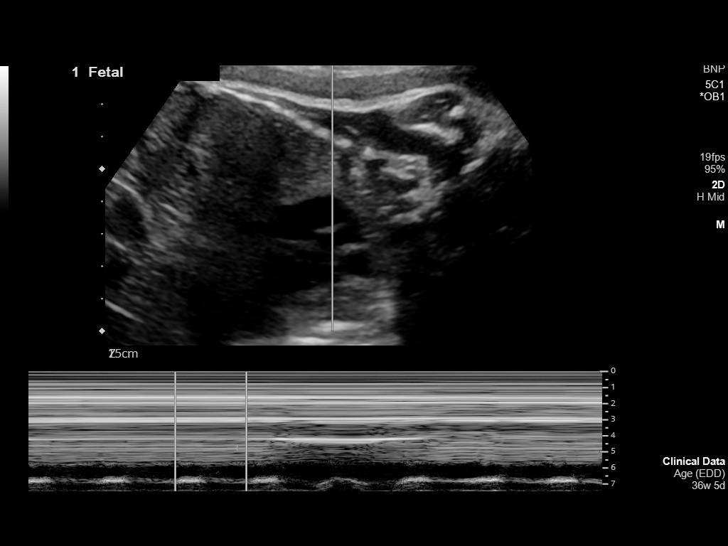
[im 3/15]
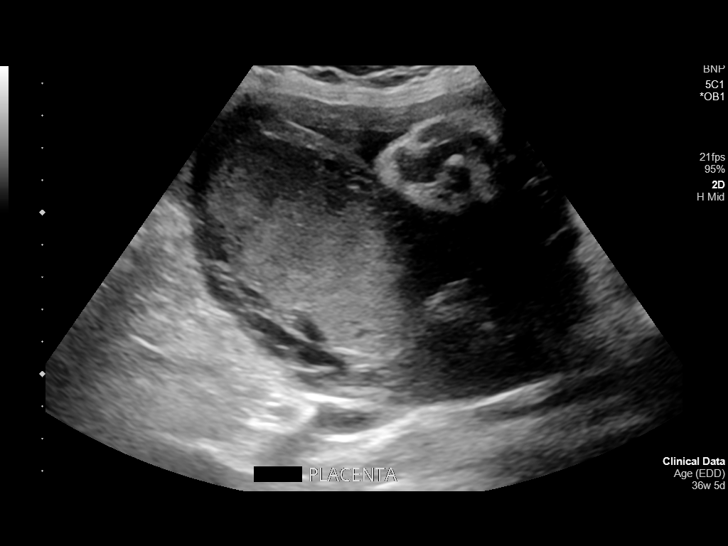
[im 4/15]
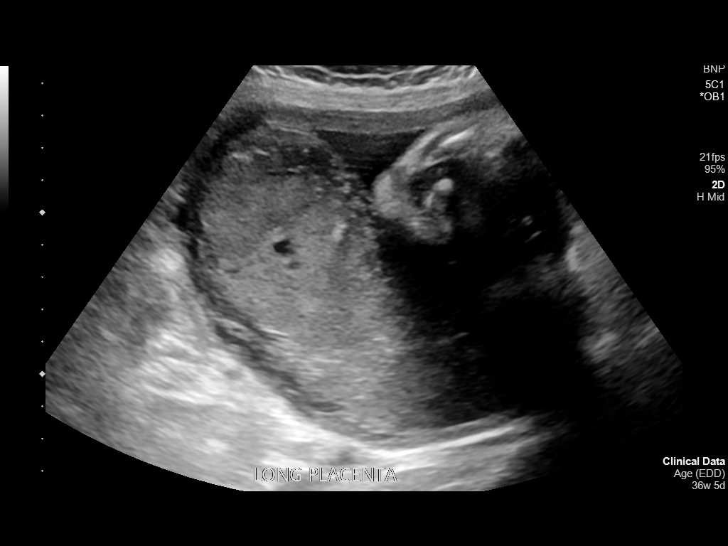
[im 5/15]
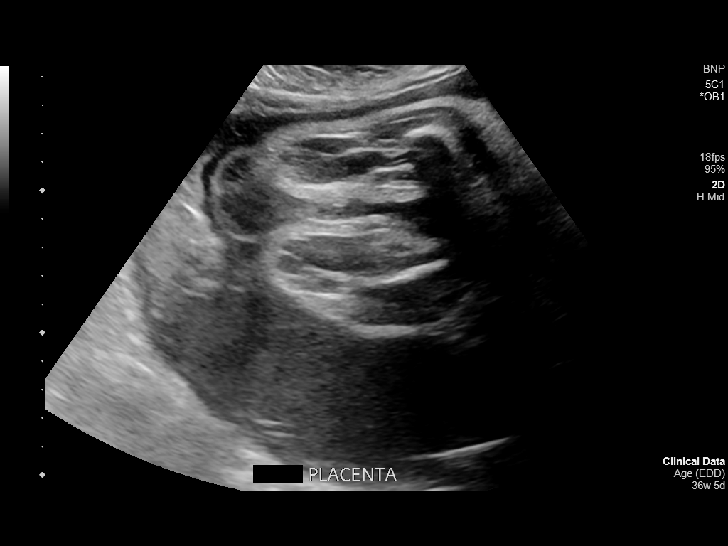
[im 6/15]
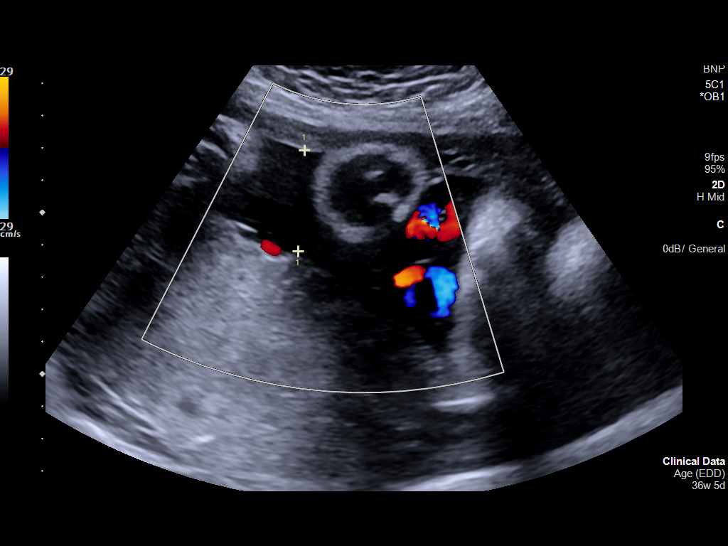
[im 7/15]
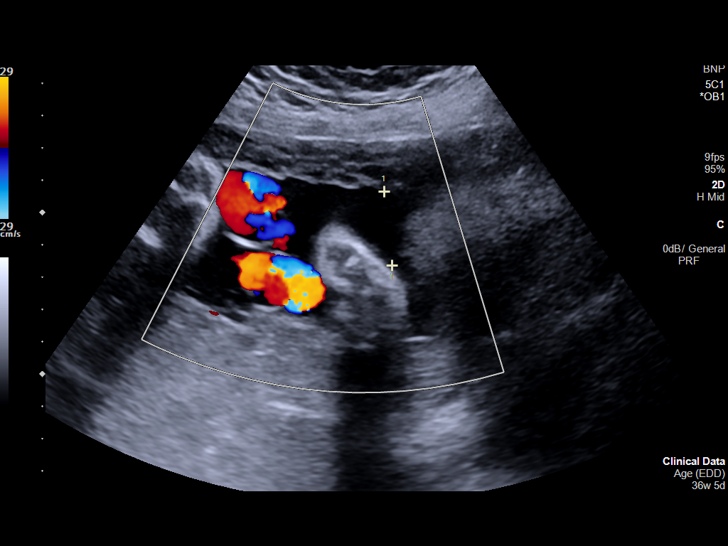
[im 9/15]
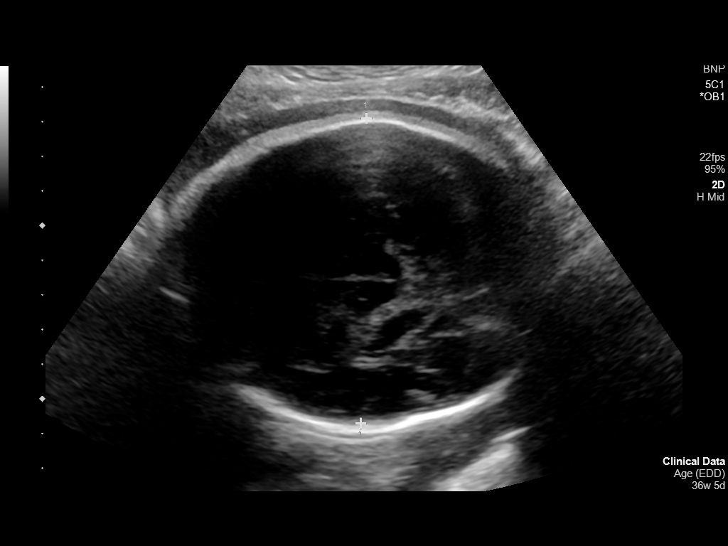
[im 10/15]
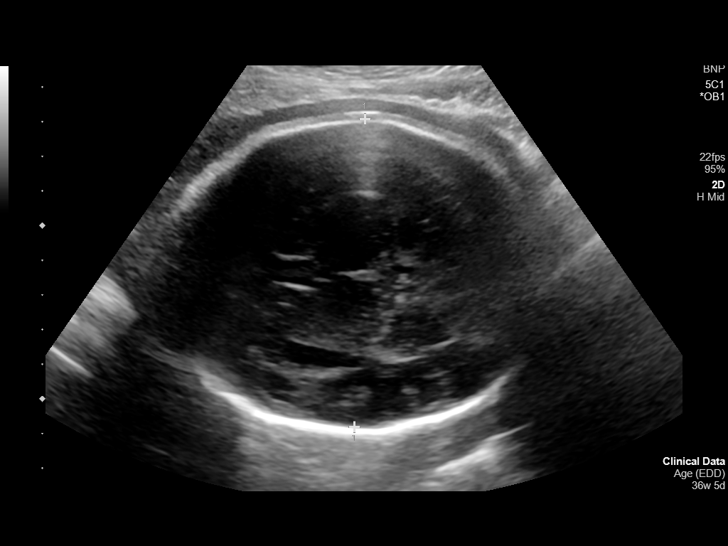
[im 11/15]
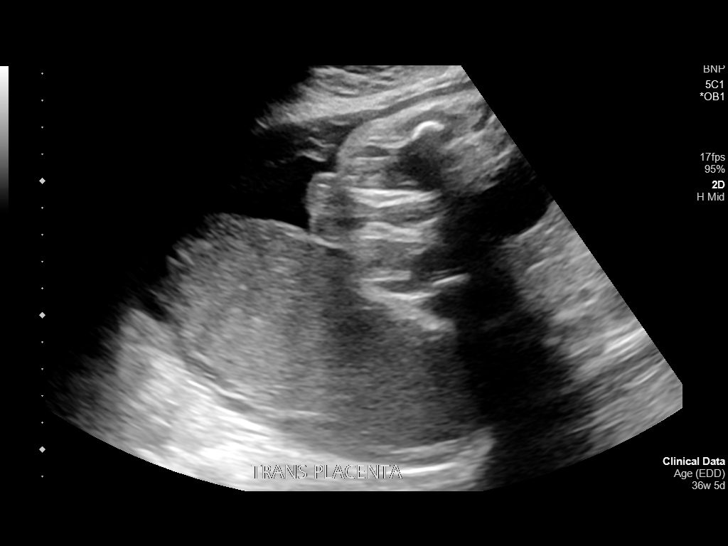
[im 12/15]
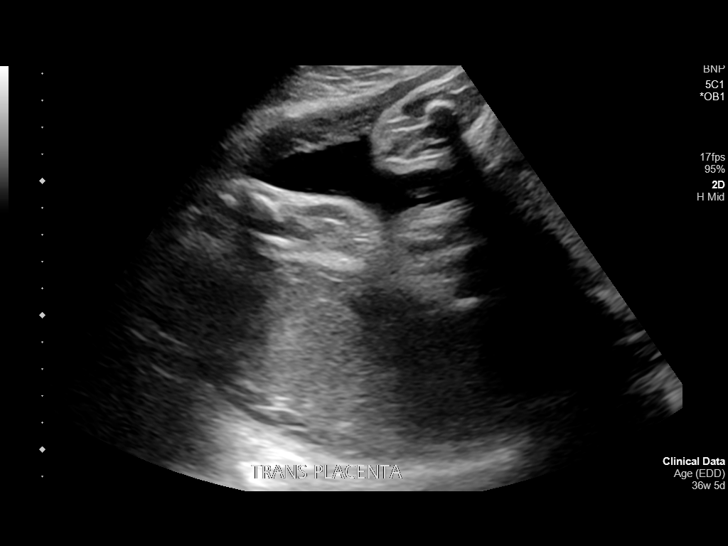
[im 13/15]
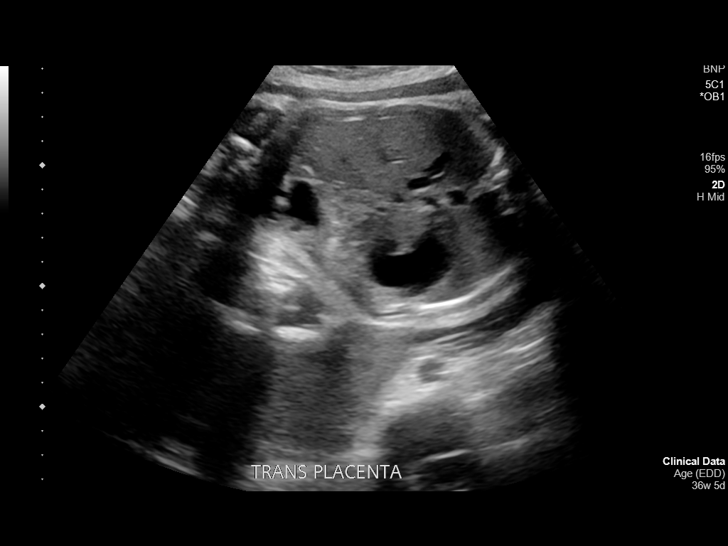
[im 14/15]
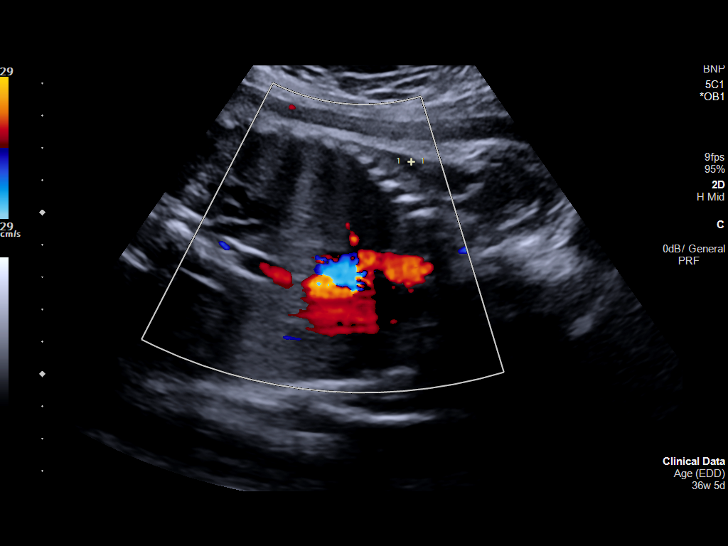
[im 15/15]
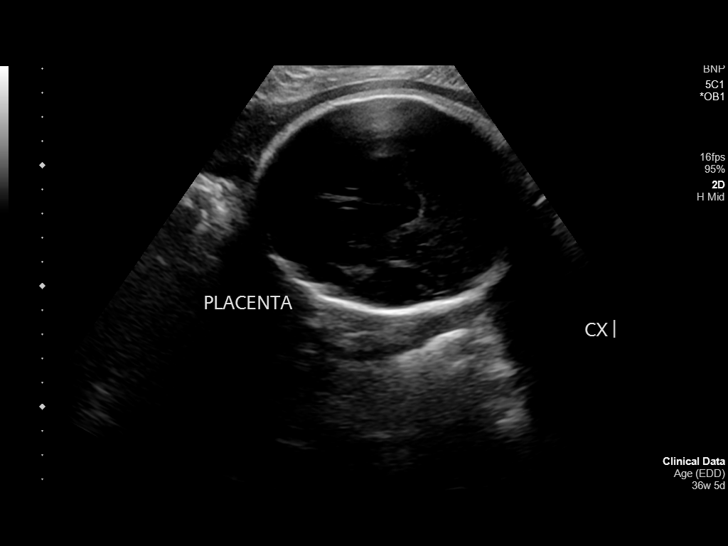

[14 of 15 positions shown; findings below may reference images not displayed]

FINDINGS: Number of Fetuses: 1

Heart Rate:  144 bpm

Movement: Present

Presentation: Cephalic

Placental Location: Posterior

Previa: None

Amniotic Fluid (Subjective):  Decreased

AFI: 6.2 cm which is below the fifth percentile.

BPD: 8.8 cm 35 w  5 d

MATERNAL FINDINGS:

Cervix:  Appears closed.

Uterus/Adnexae: No abnormality visualized.
IMPRESSION: Oligohydramnios with an amniotic fluid index of 6.2 cm which is down
from 6.9 cm on the study May 14, 2018. These values are below
the fifth percentile. Viable IUP with estimated age of 35 weeks 5
days

This exam is performed on an emergent basis and does not
comprehensively evaluate fetal size, dating, or anatomy; follow-up
complete OB US should be considered if further fetal assessment is
warranted.

## 2020-05-16 ENCOUNTER — Encounter: Payer: Self-pay | Admitting: Emergency Medicine

## 2020-05-16 ENCOUNTER — Emergency Department
Admission: EM | Admit: 2020-05-16 | Discharge: 2020-05-16 | Disposition: A | Discharge status: Home / Self Care | Payer: Self-pay | Attending: Emergency Medicine | Admitting: Emergency Medicine

## 2020-05-16 ENCOUNTER — Other Ambulatory Visit: Payer: Self-pay

## 2020-05-16 ENCOUNTER — Emergency Department: Payer: Self-pay

## 2020-05-16 DIAGNOSIS — Z79899 Other long term (current) drug therapy: Secondary | ICD-10-CM | POA: Insufficient documentation

## 2020-05-16 DIAGNOSIS — R079 Chest pain, unspecified: Secondary | ICD-10-CM

## 2020-05-16 DIAGNOSIS — R0789 Other chest pain: Secondary | ICD-10-CM | POA: Insufficient documentation

## 2020-05-16 LAB — COMPREHENSIVE METABOLIC PANEL
ALT: 37 U/L (ref 0–44)
AST: 22 U/L (ref 15–41)
Albumin: 4.2 g/dL (ref 3.5–5.0)
Alkaline Phosphatase: 84 U/L (ref 38–126)
Anion gap: 8 (ref 5–15)
BUN: 13 mg/dL (ref 6–20)
CO2: 25 mmol/L (ref 22–32)
Calcium: 9 mg/dL (ref 8.9–10.3)
Chloride: 104 mmol/L (ref 98–111)
Creatinine, Ser: 0.54 mg/dL (ref 0.44–1.00)
GFR calc Af Amer: >60 mL/min (ref >60)
GFR calc non Af Amer: >60 mL/min (ref >60)
Glucose, Bld: 93 mg/dL (ref 70–99)
Potassium: 3.7 mmol/L (ref 3.5–5.1)
Sodium: 137 mmol/L (ref 135–145)
Total Bilirubin: 0.6 mg/dL (ref 0.3–1.2)
Total Protein: 7.2 g/dL (ref 6.5–8.1)

## 2020-05-16 LAB — URINALYSIS, COMPLETE (UACMP) WITH MICROSCOPIC
Bacteria, UA: NONE SEEN
Bilirubin Urine: NEGATIVE
Glucose, UA: NEGATIVE mg/dL
Ketones, ur: NEGATIVE mg/dL
Leukocytes,Ua: NEGATIVE
Nitrite: NEGATIVE
Protein, ur: NEGATIVE mg/dL
Specific Gravity, Urine: 1.02 (ref 1.005–1.030)
pH: 6 (ref 5.0–8.0)

## 2020-05-16 LAB — CBC
HCT: 38.4 % (ref 36.0–46.0)
Hemoglobin: 13.3 g/dL (ref 12.0–15.0)
MCH: 31.4 pg (ref 26.0–34.0)
MCHC: 34.6 g/dL (ref 30.0–36.0)
MCV: 90.6 fL (ref 80.0–100.0)
Platelets: 201 10*3/uL (ref 150–400)
RBC: 4.24 MIL/uL (ref 3.87–5.11)
RDW: 12.7 % (ref 11.5–15.5)
WBC: 10.4 10*3/uL (ref 4.0–10.5)
nRBC: 0 % (ref 0.0–0.2)

## 2020-05-16 LAB — TROPONIN I (HIGH SENSITIVITY)
Troponin I (High Sensitivity): <2 ng/L (ref <18)
Troponin I (High Sensitivity): <2 ng/L (ref <18)

## 2020-05-16 LAB — LIPASE, BLOOD: Lipase: 28 U/L (ref 11–51)

## 2020-05-16 LAB — POCT PREGNANCY, URINE: Preg Test, Ur: NEGATIVE

## 2020-05-16 NOTE — ED Triage Notes (Signed)
Patient reports chest pain x2 years. States she has had pain in her right side x3 years. Patient reports pain has been worsening for the past few days. Reports vomiting every morning.

## 2020-05-16 NOTE — ED Notes (Addendum)
Pt states that she has had chest pain and side pain for the last 2-3 years. Patient denies fevers, cough, or pain while urinating.

## 2020-05-16 NOTE — ED Provider Notes (Signed)
[facility] Emergency Department Provider Note  ____________________________________________   First MD Initiated Contact with Patient 05/16/20 1621     (approximate)  I have reviewed the triage vital signs and the nursing notes.   HISTORY  Chief Complaint Chest Pain and Abdominal Pain    HPI Kristen Lawrence is a 24 y.o. female presents emergency department complaining of chest pain and side pain for 2 to 3 years.  Symptoms on and off but has been lasting longer as an hour and a half instead of shorter periods.  Patient states her grandfather died of heart disease.  Her sister is been treated for a PE.  She states she has no difficulty breathing.  No cough or congestion.  No fever or chills.  Also has vomiting in the mornings.    Past Medical History:  Diagnosis Date  . Medical history non-contributory     Patient Active Problem List   Diagnosis Date Noted  . Pregnancy 06/25/2018    Past Surgical History:  Procedure Laterality Date  . NO PAST SURGERIES      Prior to Admission medications   Medication Sig Start Date End Date Taking? Authorizing Provider  acetaminophen (TYLENOL) 325 MG tablet Take 2 tablets (650 mg total) by mouth every 4 (four) hours as needed for mild pain or moderate pain. 06/26/18   Lisette Grinder, CNM  ibuprofen (ADVIL,MOTRIN) 600 MG tablet Take 1 tablet (600 mg total) by mouth every 6 (six) hours as needed for mild pain, moderate pain or cramping. 06/26/18   Lisette Grinder, CNM  Prenatal Vit-Fe Fumarate-FA (PRENATAL MULTIVITAMIN) TABS tablet Take 1 tablet by mouth daily at 12 noon.    [provider]    Allergies Patient has no known allergies.  No family history on file.  Social History Social History   Tobacco Use  . Smoking status: Never Smoker  . Smokeless tobacco: Never Used  Substance Use Topics  . Alcohol use: Never  . Drug use: Never    Review of Systems  Constitutional: No  fever/chills Eyes: No visual changes. ENT: No sore throat. Respiratory: Denies cough Cardiovascular: Positive chest pain Gastrointestinal: Denies abdominal pain Genitourinary: Negative for dysuria. Musculoskeletal: Negative for back pain. Skin: Negative for rash. Psychiatric: no mood changes,     ____________________________________________   PHYSICAL EXAM:  VITAL SIGNS: ED Triage Vitals  Enc Vitals Group     BP 05/16/20 1338 109/72     Pulse Rate 05/16/20 1338 75     Resp 05/16/20 1338 16     Temp 05/16/20 1338 98 F (36.7 C)     Temp Source 05/16/20 1338 Oral     SpO2 05/16/20 1338 100 %     Weight 05/16/20 1339 175 lb (79.4 kg)     Height 05/16/20 1339 5\' 4"  (1.626 m)     Head Circumference --      Peak Flow --      Pain Score 05/16/20 1339 6     Pain Loc --      Pain Edu? --      Excl. in [place]? --     Constitutional: Alert and oriented. Well appearing and in no acute distress. Eyes: Conjunctivae are normal.  Head: Atraumatic. Nose: No congestion/rhinnorhea. Mouth/Throat: Mucous membranes are moist.   Neck:  supple no lymphadenopathy noted Cardiovascular: Normal rate, regular rhythm. Heart sounds are normal Respiratory: Normal respiratory effort.  No retractions, lungs c t a  Abd: soft nontender bs normal all  4 quad GU: deferred Musculoskeletal: FROM all extremities, warm and well perfused Neurologic:  Normal speech and language.  Skin:  Skin is warm, dry and intact. No rash noted. Psychiatric: Mood and affect are normal. Speech and behavior are normal.  ____________________________________________   LABS (all labs ordered are listed, but only abnormal results are displayed)  Labs Reviewed  URINALYSIS, COMPLETE (UACMP) WITH MICROSCOPIC - Abnormal; Notable for the following components:      Result Value   Color, Urine YELLOW (*)    APPearance CLEAR (*)    Hgb urine dipstick SMALL (*)    All other components within normal limits  LIPASE, BLOOD    COMPREHENSIVE METABOLIC PANEL  CBC  POC URINE PREG, ED  POCT PREGNANCY, URINE  TROPONIN I (HIGH SENSITIVITY)  TROPONIN I (HIGH SENSITIVITY)   ____________________________________________   ____________________________________________  RADIOLOGY  Chest x-ray is normal  ____________________________________________   PROCEDURES  Procedure(s) performed: EKG shows bradycardia, see physician read   Procedures    ____________________________________________   INITIAL IMPRESSION / ASSESSMENT AND PLAN / ED COURSE  Pertinent labs & imaging results that were available during my care of the patient were reviewed by me and considered in my medical decision making (see chart for details).   Patient is 24 year old female presents emergency department with complaints of chest pain on and off for 2 to 3 years.  See HPI.  Physical exam shows patient vitals to be normal.  Patient does appear well.  Exam is unremarkable.  No murmurs are noted.  DDx: Chest wall pain, anxiety, MI  CBC is normal, comprehensive metabolic panel is normal, UA is normal, POC pregnancy is normal, lipase normal, troponins normal  Due to the patient's vitals being normal and she is not tachycardic I do not feel that she needs to be evaluated for PE at this time.  She is no longer having pain and is greatly relieved after we told her her labs and EKG are normal.  I did offer that she can follow-up with a cardiologist for further evaluation if she continues to have chest pain on and off.  I did also explain to her this could also be anxiety.  She is to follow-up with her regular doctor concerning this.  She states she understands will comply.  Strict instructions to return if worsening.    Kristen Lawrence Kristen Lawrence was evaluated in Emergency Department on 05/16/2020 for the symptoms described in the history of present illness. She was evaluated in the context of the global COVID-19 pandemic, which necessitated  consideration that the patient might be at risk for infection with the SARS-CoV-2 virus that causes COVID-19. Institutional protocols and algorithms that pertain to the evaluation of patients at risk for COVID-19 are in a state of rapid change based on information released by regulatory bodies including the CDC and federal and state organizations. These policies and algorithms were followed during the patient's care in the ED.   As part of my medical decision making, I reviewed the following data within the electronic MEDICAL RECORD NUMBER Nursing notes reviewed and incorporated, Labs reviewed , EKG interpreted bradycardia see physician read, Old chart reviewed, Radiograph reviewed , Notes from prior ED visits and [place] Controlled Substance Database  ____________________________________________   FINAL CLINICAL IMPRESSION(S) / ED DIAGNOSES  Final diagnoses:  Nonspecific chest pain      NEW MEDICATIONS STARTED DURING THIS VISIT:  New Prescriptions   No medications on file     Note:  This document was  prepared using Conservation officer, historic buildings and may include unintentional dictation errors.    Faythe Ghee, PA-C 05/16/20 1705    Concha Se, MD 05/16/20 1726

## 2021-12-01 NOTE — L&D Delivery Note (Signed)
Delivery Note ? ?Date of delivery: 02/17/2022 ?Estimated Date of Delivery: 02/09/22 ?No LMP recorded. Patient is pregnant. ?EGA: [redacted]w[redacted]d ? ?Delivery Note ?At 6:46 AM a viable female was delivered via Vaginal, Spontaneous (Presentation: Right Occiput Posterior).  APGAR: 8, 9; weight pending.  Placenta status: Spontaneous, Intact.  Cord: 3 vessels with the following complications: None.   ? ?First Stage: ?Labor onset: unknown ?Augmentation : Cytotec, Pitocin ?Analgesia /Anesthesia intrapartum: Epidural ?AROM at 0120 ? ?Kristen Lawrence presented to L&D for IOL for postdates. She was augmented with pitocin. Epidural placed for pain relief.  ? ?Second Stage: ?Complete dilation at 0620 ?Onset of pushing at 0624 ?FHR second stage Cat I ?Delivery at 0646 on 02/17/2022 ? ?She progressed to complete and had a spontaneous vaginal birth of a live female over an intact perineum. The fetal head was delivered in OA position with restitution to ROA. No nuchal cord. Anterior then posterior shoulders delivered spontaneously. Baby placed on mom's abdomen and attended to by transition RN. Cord clamped and cut when pulseless by FOB. Cord blood obtained for newborn labs. ? ?Third Stage: ?Placenta delivered intact with 3VC at [phone] ?Placenta disposition: routine disposal ?Uterine tone firm / bleeding min ?IV pitocin given for hemorrhage prophylaxis ? ?Anesthesia: Epidural ?Episiotomy: None ?Lacerations: 2nd degree;Perineal;Vaginal ?Suture Repair: 2.0 vicryl ?Est. Blood Loss (mL):  300 ? ?Complications: none ? ?Mom to postpartum.  Baby to Couplet care / Skin to Skin. ? ?Newborn: ?Birth Weight: pending  ?Apgar Scores: 8, 9 ?Feeding planned: Breastfeeding ? ? ?Cyril Mourning, CNM ?02/17/2022 7:17 AM ?  ?

## 2022-02-03 ENCOUNTER — Other Ambulatory Visit: Payer: Self-pay | Admitting: Certified Nurse Midwife

## 2022-02-03 DIAGNOSIS — Z3490 Encounter for supervision of normal pregnancy, unspecified, unspecified trimester: Secondary | ICD-10-CM

## 2022-02-03 NOTE — Progress Notes (Signed)
Kristen Lawrence at [redacted]w[redacted]d, LMP of 05/05/21, c/w [place] at [redacted]w[redacted]d.  ?Scheduled for induction of labor for postdates on 02/16/22.  ? 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 ?Prenatal Labs: ?Blood type/Rh O neg  ?Antibody screen neg  ?Rubella Non Immune  ?Varicella Immune  ?RPR NR  ?HBsAg Neg  ?HIV NR  ?GC neg  ?Chlamydia neg  ?Genetic screening cfDNA negative  ?1 hour GTT 99  ?3 hour GTT N/A  ?GBS neg  ? ?Tdap: 04/30/18 ?Flu: 09/06/21 ?Contraception: ParaGard IUD ?Feeding preference: Breast ? ?____ ?[name], CNM ?Certified Nurse Midwife ?[facility]  Clinic OB/GYN ?[facility]   ?

## 2022-02-10 ENCOUNTER — Observation Stay
Admission: EM | Admit: 2022-02-10 | Discharge: 2022-02-10 | Disposition: A | Discharge status: Home / Self Care | Payer: Self-pay | Attending: Obstetrics | Admitting: Obstetrics

## 2022-02-10 DIAGNOSIS — O479 False labor, unspecified: Secondary | ICD-10-CM | POA: Diagnosis present

## 2022-02-10 DIAGNOSIS — O471 False labor at or after 37 completed weeks of gestation: Principal | ICD-10-CM | POA: Insufficient documentation

## 2022-02-10 DIAGNOSIS — O480 Post-term pregnancy: Secondary | ICD-10-CM | POA: Insufficient documentation

## 2022-02-10 DIAGNOSIS — Z3A40 40 weeks gestation of pregnancy: Secondary | ICD-10-CM | POA: Insufficient documentation

## 2022-02-10 NOTE — Discharge Summary (Signed)
Kristen Lawrence is a 26 y.o. female. She is at [redacted]w[redacted]d gestation. No LMP recorded. Patient is pregnant. ?Estimated Date of Delivery: 02/09/22 ? ?[facility]renatal care site: [facility] ? ?Chief complaint: painful uterine contractions ? ?HPI: Kristen Lawrence presents to L&D with complaints of painful uterine contractions ? 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 ?S: Resting comfortably.no VB.no LOF,  Active fetal movement.  ? ?Maternal Medical History:  ?Past Medical Hx:  has a past medical history of Medical history non-contributory.   ? ?Past Surgical Hx:  has a past surgical history that includes No past surgeries.  ? ?No Known Allergies  ? ?Prior to Admission medications   ?Medication Sig Start Date End Date Taking? Authorizing Provider  ?acetaminophen (TYLENOL) 325 MG tablet Take 2 tablets (650 mg total) by mouth every 4 (four) hours as needed for mild pain or moderate pain. 06/26/18   Genia Del, CNM  ?ibuprofen (ADVIL,MOTRIN) 600 MG tablet Take 1 tablet (600 mg total) by mouth every 6 (six) hours as needed for mild pain, moderate pain or cramping. 06/26/18   Genia Del, CNM  ?Prenatal Vit-Fe Fumarate-FA (PRENATAL MULTIVITAMIN) TABS tablet Take 1 tablet by mouth daily at 12 noon.    [provider]  ? ? ?Social History: She  reports that she has never smoked. She has never used smokeless tobacco. She reports that she does not drink alcohol and does not use drugs. ? ?Family History: family history is not on file. ,no history of gyn cancers ? ?Review of Systems: A full review of systems was performed and negative except as noted in the HPI.   ? ?O: ? There were no vitals taken for this visit. ?No results found for this or any previous visit (from the past 48 hour(s)).  ? ?Constitutional: NAD, AAOx3  ?HE/ENT: extraocular movements grossly intact, moist mucous membranes ?CV: RRR ?PULM: nl  respiratory effort ?Abd: gravid, non-tender, non-distended, soft  ?Ext: Non-tender, Nonedmeatous ?Psych: mood appropriate, speech normal ?Pelvic : deferred ?SVE: Dilation: 2 ?Effacement (%): 50 ?Cervical Position: Posterior ?Station: -3 ?Exam by:: D. MEans, RN  ? ?Fetal Monitor: ?Baseline: 125 bpm ?Variability: moderate ?Accels: Present ?Decels: none ?Toco: irregular, every 2-9 minutes ? ?Category: I ?NST: reactive ? ? ?Assessment: 26 y.o. [redacted]w[redacted]d here for antenatal surveillance during pregnancy. ? ?Principle diagnosis: Uterine contractions [O47.9]  ? ?Plan: ?Labor: latent labor present ?Patient wants low interventions during labor and delivery ?NST reviewed  ?Fetal Wellbeing: Reassuring Cat 1 tracing. ?Reactive NST  ?D/c home stable, precautions reviewed, patient to follow up with labor and delivery when contractions are consistently 5-6 mins apart ? ?----- ?[name] CNM ?Certified Nurse Midwife ?[facility]  Clinic OB/GYN ?[facility]  ?  ?

## 2022-02-10 NOTE — Progress Notes (Signed)
Notified F. Rubye Oaks, CNM by phone of patient's arrival and chief complaint with SBAR. Patient desires no pain control as she has not used epidural or IV pain meds in previous vaginal deliveries. New Telephone orders given to rule out labor and if no changes in patient status patient can be discharged to home with labor precautions in 1 hour from first cervical check. Will notify patient on plan of care.   ?

## 2022-02-10 NOTE — Progress Notes (Signed)
?   02/10/22 0341  ?AVS Discharge Documentation  ?AVS Discharge Instructions Including Medications Provided to patient/caregiver  ?Name of Person Receiving AVS Discharge Instructions Including Medications Kristen Lawrence  ?Name of Clinician That Reviewed AVS Discharge Instructions Including Medications Nani Ravens, RN  ? ?Patient discharged to home with labor precautions and verbalized understanding of when to return. FOB Freddy at side and will drive patient home in private passenger vehicle. Patient took all personal belongings with her at side. L&D triage complete. I, RN sign off on care. ?

## 2022-02-10 NOTE — OB Triage Note (Signed)
Patient arrived to unit in wheelchair steered by ED staff member. Pt placed on patient gown and triaged for contactions that started ealier today. Pt reports prenatal care at [facility] drew clinic and she plans to deliver with [facility]. Pt reported no symptoms consistent with active vaginal bleeding or ROM. Pt placed on EFM and toco to non-tender area of abdomen. Patient advised of visitation policy and personal belongins and valuables policy. Patient oriented to room . Will noitify provider of patient's arrival.  ?

## 2022-02-16 ENCOUNTER — Encounter: Payer: Self-pay | Admitting: Obstetrics and Gynecology

## 2022-02-16 ENCOUNTER — Inpatient Hospital Stay
Admission: EM | Admit: 2022-02-16 | Discharge: 2022-02-18 | DRG: 807 | Disposition: A | Discharge status: Home / Self Care | Payer: Medicaid Other | Attending: Certified Nurse Midwife | Admitting: Certified Nurse Midwife

## 2022-02-16 ENCOUNTER — Other Ambulatory Visit: Payer: Self-pay

## 2022-02-16 DIAGNOSIS — Z23 Encounter for immunization: Secondary | ICD-10-CM | POA: Diagnosis not present

## 2022-02-16 DIAGNOSIS — Z3490 Encounter for supervision of normal pregnancy, unspecified, unspecified trimester: Secondary | ICD-10-CM | POA: Diagnosis present

## 2022-02-16 DIAGNOSIS — O480 Post-term pregnancy: Secondary | ICD-10-CM | POA: Diagnosis present

## 2022-02-16 DIAGNOSIS — Z3A41 41 weeks gestation of pregnancy: Secondary | ICD-10-CM | POA: Diagnosis not present

## 2022-02-16 LAB — CBC
HCT: 39.7 % (ref 36.0–46.0)
Hemoglobin: 13.3 g/dL (ref 12.0–15.0)
MCH: 30.9 pg (ref 26.0–34.0)
MCHC: 33.5 g/dL (ref 30.0–36.0)
MCV: 92.1 fL (ref 80.0–100.0)
Platelets: 175 10*3/uL (ref 150–400)
RBC: 4.31 MIL/uL (ref 3.87–5.11)
RDW: 13.2 % (ref 11.5–15.5)
WBC: 9.8 10*3/uL (ref 4.0–10.5)
nRBC: 0 % (ref 0.0–0.2)

## 2022-02-16 LAB — TYPE AND SCREEN
ABO/RH(D): O NEG
Antibody Screen: POSITIVE

## 2022-02-16 MED ORDER — LIDOCAINE HCL (PF) 1 % IJ SOLN
30.0000 mL | INTRAMUSCULAR | Status: DC | PRN
  Filled 2022-02-16: qty 30

## 2022-02-16 MED ORDER — SOD CITRATE-CITRIC ACID 500-334 MG/5ML PO SOLN: 30.0000 mL | ORAL | Status: DC | PRN

## 2022-02-16 MED ORDER — TERBUTALINE SULFATE 1 MG/ML IJ SOLN: 0.2500 mg | Freq: Once | INTRAMUSCULAR | Status: DC | PRN

## 2022-02-16 MED ORDER — LACTATED RINGERS IV SOLN: INTRAVENOUS | Status: DC

## 2022-02-16 MED ORDER — ACETAMINOPHEN 325 MG PO TABS: 650.0000 mg | ORAL_TABLET | ORAL | Status: DC | PRN

## 2022-02-16 MED ORDER — LACTATED RINGERS IV SOLN: 500.0000 mL | INTRAVENOUS | Status: DC | PRN

## 2022-02-16 MED ORDER — OXYTOCIN 10 UNIT/ML IJ SOLN
INTRAMUSCULAR | Status: AC
  Filled 2022-02-16: qty 2

## 2022-02-16 MED ORDER — MISOPROSTOL 200 MCG PO TABS
ORAL_TABLET | ORAL | Status: AC
  Administered 2022-02-16: 25 ug via BUCCAL
  Filled 2022-02-16: qty 4

## 2022-02-16 MED ORDER — OXYTOCIN BOLUS FROM INFUSION
333.0000 mL | Freq: Once | INTRAVENOUS | Status: AC
  Administered 2022-02-17: 333 mL via INTRAVENOUS

## 2022-02-16 MED ORDER — AMMONIA AROMATIC IN INHA
RESPIRATORY_TRACT | Status: AC
  Filled 2022-02-16: qty 10

## 2022-02-16 MED ORDER — ONDANSETRON HCL 4 MG/2ML IJ SOLN: 4.0000 mg | Freq: Four times a day (QID) | INTRAMUSCULAR | Status: DC | PRN

## 2022-02-16 MED ORDER — BUTORPHANOL TARTRATE 1 MG/ML IJ SOLN: 1.0000 mg | INTRAMUSCULAR | Status: DC | PRN

## 2022-02-16 MED ORDER — MISOPROSTOL 25 MCG QUARTER TABLET
25.0000 ug | ORAL_TABLET | ORAL | Status: DC | PRN
Start: 2022-02-16 — End: 2022-02-17
  Administered 2022-02-16 (×3): 25 ug via VAGINAL
  Filled 2022-02-16 (×3): qty 1

## 2022-02-16 MED ORDER — OXYTOCIN-SODIUM CHLORIDE 30-0.9 UT/500ML-% IV SOLN
2.5000 [IU]/h | INTRAVENOUS | Status: DC
  Filled 2022-02-16: qty 500

## 2022-02-16 MED ORDER — MISOPROSTOL 25 MCG QUARTER TABLET
25.0000 ug | ORAL_TABLET | ORAL | Status: DC | PRN
  Administered 2022-02-16 (×2): 25 ug via BUCCAL
  Filled 2022-02-16 (×3): qty 1

## 2022-02-16 MED ORDER — OXYTOCIN-SODIUM CHLORIDE 30-0.9 UT/500ML-% IV SOLN
1.0000 m[IU]/min | INTRAVENOUS | Status: DC
  Administered 2022-02-16: 2 m[IU]/min via INTRAVENOUS

## 2022-02-16 NOTE — Progress Notes (Signed)
Labor Progress Note ? ?Kristen Lawrence Kristen Lawrence is a 26 y.o. G3P2002 at [redacted]w[redacted]d by LMP admitted for induction of labor due to Post dates. Due date 02/09/22. ? ?Subjective: Pt has been up moving around.  She reports more pain.  ? ?Objective: ?BP 137/85 (BP Location: Left Arm)   Pulse 97   Temp 98.8 ?F (37.1 ?C) (Oral)   Resp 16   Ht 5\' 3"  (1.6 m)   Wt 100.7 kg   BMI 39.33 kg/m?  ? ?Fetal Assessment: ?FHT:  FHR: 135 bpm, variability: moderate,  accelerations:  Present,  decelerations:  Absent ?Category/reactivity:  Category I ?UC:   irregular, every 1-3 minutes ?SVE:    ?Dilation: 3cm  ?Effacement: 50%  ?Station:  -2  ?Consistency: medium  ?Position: anterior  ?Membrane status:Intact ?Amniotic color: n/a ? ?Labs: ?Lab Results  ?Component Value Date  ? WBC 9.8 02/16/2022  ? HGB 13.3 02/16/2022  ? HCT 39.7 02/16/2022  ? MCV 92.1 02/16/2022  ? PLT 175 02/16/2022  ? ? ?Assessment / Plan: ?Induction of labor due to postterm,  progressing well on pitocin ?0609 Cytotec given ?1026 Cytotec given ?1445 Cytotec given ? ?Labor: Progressing normally ?Preeclampsia:   137/85 ?Fetal Wellbeing:  Category I ?Pain Control:  Labor support without medications ?I/D:   Afebrile, GBS neg, Intact ?Anticipated MOD:  NSVD ? ?0610, CNM ?02/16/2022, 2:48 PM ? ? ? ? ? ? ?  ?

## 2022-02-16 NOTE — Progress Notes (Signed)
Labor Progress Note ? ?Kristen Lawrence is a 26 y.o. G3P2002 at [redacted]w[redacted]d by LMP admitted for induction of labor due to Post dates. Due date 02/09/22. ? ?Subjective: Pt reports increased pain. ? ?Objective: ?BP 127/78   Pulse 62   Temp 98.4 ?F (36.9 ?C) (Oral)   Resp 16   Ht 5\' 3"  (1.6 m)   Wt 100.7 kg   BMI 39.33 kg/m?  ? ?Fetal Assessment: ?FHT:  FHR: 120 bpm, variability: moderate,  accelerations:  Present,  decelerations:  Absent ?Category/reactivity:  Category I ?UC:   regular, every 2-3 minutes ?SVE:    ?Dilation: 3.5cm  ?Effacement: 80%  ?Station:  -2  ?Consistency: soft  ?Position: anterior  ?Membrane status:Intact ?Amniotic color: n/a ? ?Labs: ?Lab Results  ?Component Value Date  ? WBC 9.8 02/16/2022  ? HGB 13.3 02/16/2022  ? HCT 39.7 02/16/2022  ? MCV 92.1 02/16/2022  ? PLT 175 02/16/2022  ? ? ?Assessment / Plan: ?Induction of labor due to postterm,  progressing well on pitocin ?0609 Cytotec given ?1026 Cytotec given ?1445 Cytotec given ?1947 Pitocin started ?Currently on 71mU ? ?Labor: Progressing normally ?Preeclampsia:   127/78 ?Fetal Wellbeing:  Category I ?Pain Control:  Labor support without medications ?I/D:   Afebrile, GBS neg, Intact ?Anticipated MOD:  NSVD ? ?3m, CNM ?02/16/2022, 8:08 PM ? ? ? ? ? ? ?  ?

## 2022-02-16 NOTE — H&P (Signed)
OB History & Physical  ? ?History of Present Illness:  ?Chief Complaint:  ? ?HPI:  ?Kristen Lawrence is a 26 y.o. [id number] female at [redacted]w[redacted]d dated by LMP of 05/05/21.  She presents to L&D for IOL for postdates ? ?She reports:  ?-active fetal movement ?-no leakage of fluid ?-no vaginal bleeding ?-no contractions ? 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 ? ?Maternal Medical History:  ? ?Past Medical History:  ?Diagnosis Date  ? Medical history non-contributory   ? ? ?Past Surgical History:  ?Procedure Laterality Date  ? APPENDECTOMY    ? NO PAST SURGERIES    ? ? ?No Known Allergies ? ?Prior to Admission medications   ?Medication Sig Start Date End Date Taking? Authorizing Provider  ?acetaminophen (TYLENOL) 325 MG tablet Take 2 tablets (650 mg total) by mouth every 4 (four) hours as needed for mild pain or moderate pain. 06/26/18   Genia Del, CNM  ?ibuprofen (ADVIL,MOTRIN) 600 MG tablet Take 1 tablet (600 mg total) by mouth every 6 (six) hours as needed for mild pain, moderate pain or cramping. 06/26/18   Genia Del, CNM  ?Prenatal Vit-Fe Fumarate-FA (PRENATAL MULTIVITAMIN) TABS tablet Take 1 tablet by mouth daily at 12 noon.    [provider]  ? ? ? ?Pren[facility] care site: [facility] ? ?Social History: She  reports that she has never smoked. She has never used smokeless tobacco. She reports that she does not drink alcohol and does not use drugs. ? ?Family History: family history is not on file.  ? ?Review of Systems: A full review of systems was performed and negative except as noted in the HPI.   ? ?Physical Exam:  ?Vital Signs: BP (!) 141/83 (BP Location: Left Arm)   Pulse 73   Temp 98.6 ?F (37 ?C) (Oral)   Resp 16   Ht 5\' 3"  (1.6 m)   Wt 100.7 kg   BMI 39.33 kg/m?  ? ?General:   alert and cooperative  ?Skin:  normal  ?Neurologic:    Alert & oriented x 3  ?Lungs:    Nl effort  ?Heart:    regular rate and rhythm  ?Abdomen:  soft, non-tender; bowel sounds normal; no masses,  no organomegaly  ?Extremities: : non-tender, symmetric, No edema bilaterally.     ? ? ?Results for orders placed or performed during the hospital encounter of 02/16/22 (from the past 24 hour(s))  ?CBC     Status: None  ? Collection Time: 02/16/22  5:34 AM  ?Result Value Ref Range  ? WBC 9.8 4.0 - 10.5 K/uL  ? RBC 4.31 3.87 - 5.11 MIL/uL  ? Hemoglobin 13.3 12.0 - 15.0 g/dL  ? HCT 39.7 36.0 - 46.0 %  ? MCV 92.1 80.0 - 100.0 fL  ? MCH 30.9 26.0 - 34.0 pg  ? MCHC 33.5 30.0 - 36.0 g/dL  ? RDW 13.2 11.5 - 15.5 %  ? Platelets 175 150 - 400 K/uL  ? nRBC 0.0 0.0 - 0.2 %  ?Type and screen     Status: None  ? Collection Time: 02/16/22  5:34 AM  ?Result Value Ref Range  ? ABO/RH(D) O NEG   ? Antibody Screen POS   ? Sample Expiration 02/19/2022,2359   ? Antibody Identification    ?  PASSIVELY ACQUIRED ANTI-D ?Performed at [facility], [address], [place], [place] ?  ? ? ?Pertinent  Results:  ?Prenatal Labs: ?Blood type/Rh O neg  ?Antibody screen neg  ?Rubella Non Immune  ?Varicella Immune  ?RPR NR  ?HBsAg Neg  ?HIV NR  ?GC neg  ?Chlamydia neg  ?Genetic screening cfDNA negative  ?1 hour GTT 99  ?3 hour GTT N/A  ?GBS neg  ? ?FHT: FHR: 125 bpm, variability: moderate,  accelerations:  Present,  decelerations:  Absent ?Category/reactivity:  Category I ?TOCO: irregular, every 2-4 minutes ?SVE: Dilation: 2.5 / Effacement (%): 50 / Station: -3  ?  ? ? ?Assessment:  ?Kristen Lawrence is a 26 y.o. [phone] female at [redacted]w[redacted]d with postdates pregnancy.  ? ?Plan:  ?1. Admit to Labor & Delivery; consents reviewed and obtained ? ?2. Fetal Well being  ?- Fetal Tracing: Cat I ?- GBS neg ?- Presentation: vtx confirmed by sve  ? ?3. Routine OB: ?- Prenatal labs reviewed, as above ?- Rh neg ?- CBC & T&S on admit ?- Clear fluids, IVF ? ?4. Induction of Labor ?-  Contractions external toco in place ?-  Plan for induction with Cytotec  and Pitocin ?-  Plan for continuous fetal monitoring  ?-  Maternal pain control as desired: IVPM, nitrous, regional anesthesia ?- Anticipate vaginal delivery ? ?5. Post Partum Planning: ?- Tdap: 04/30/18 ?- Flu: 09/06/21 ?- Contraception: ParaGard IUD ?- Feeding preference: Breast ? ?Haroldine Laws, CNM ?02/16/2022 9:39 AM ? ?  ?

## 2022-02-17 ENCOUNTER — Inpatient Hospital Stay: Payer: Medicaid Other | Admitting: Anesthesiology

## 2022-02-17 ENCOUNTER — Encounter: Payer: Self-pay | Admitting: Obstetrics and Gynecology

## 2022-02-17 LAB — RPR: RPR Ser Ql: NONREACTIVE

## 2022-02-17 MED ORDER — COCONUT OIL OIL
1.0000 "application " | TOPICAL_OIL | Status: DC | PRN
  Administered 2022-02-18: 1 via TOPICAL
  Filled 2022-02-17: qty 120

## 2022-02-17 MED ORDER — BENZOCAINE-MENTHOL 20-0.5 % EX AERO: 1.0000 "application " | INHALATION_SPRAY | CUTANEOUS | Status: DC | PRN

## 2022-02-17 MED ORDER — MEASLES, MUMPS & RUBELLA VAC IJ SOLR
0.5000 mL | INTRAMUSCULAR | Status: AC | PRN
  Administered 2022-02-18: 0.5 mL via SUBCUTANEOUS
  Filled 2022-02-17 (×2): qty 0.5

## 2022-02-17 MED ORDER — LACTATED RINGERS IV SOLN
500.0000 mL | Freq: Once | INTRAVENOUS | Status: AC
  Administered 2022-02-17: 500 mL via INTRAVENOUS

## 2022-02-17 MED ORDER — DIPHENHYDRAMINE HCL 50 MG/ML IJ SOLN: 12.5000 mg | INTRAMUSCULAR | Status: DC | PRN

## 2022-02-17 MED ORDER — ONDANSETRON HCL 4 MG/2ML IJ SOLN
4.0000 mg | INTRAMUSCULAR | Status: DC | PRN
Start: 2022-02-17 — End: 2022-02-18

## 2022-02-17 MED ORDER — FERROUS SULFATE 325 (65 FE) MG PO TABS
325.0000 mg | ORAL_TABLET | Freq: Two times a day (BID) | ORAL | Status: DC
  Administered 2022-02-17 – 2022-02-18 (×2): 325 mg via ORAL
  Filled 2022-02-17 (×2): qty 1

## 2022-02-17 MED ORDER — IBUPROFEN 600 MG PO TABS
600.0000 mg | ORAL_TABLET | Freq: Four times a day (QID) | ORAL | Status: DC
  Administered 2022-02-17 – 2022-02-18 (×4): 600 mg via ORAL
  Filled 2022-02-17 (×4): qty 1

## 2022-02-17 MED ORDER — FENTANYL-BUPIVACAINE-NACL 0.5-0.125-0.9 MG/250ML-% EP SOLN
12.0000 mL/h | EPIDURAL | Status: DC | PRN
  Administered 2022-02-17: 12 mL/h via EPIDURAL

## 2022-02-17 MED ORDER — OXYTOCIN 10 UNIT/ML IJ SOLN
10.0000 [IU] | Freq: Once | INTRAMUSCULAR | Status: AC
  Administered 2022-02-17: 10 [IU] via INTRAMUSCULAR

## 2022-02-17 MED ORDER — BUPIVACAINE HCL (PF) 0.25 % IJ SOLN
INTRAMUSCULAR | Status: DC | PRN
  Administered 2022-02-17: 9 mL via EPIDURAL

## 2022-02-17 MED ORDER — WITCH HAZEL-GLYCERIN EX PADS: 1.0000 "application " | MEDICATED_PAD | CUTANEOUS | Status: DC | PRN

## 2022-02-17 MED ORDER — SIMETHICONE 80 MG PO CHEW: 80.0000 mg | CHEWABLE_TABLET | ORAL | Status: DC | PRN

## 2022-02-17 MED ORDER — FENTANYL-BUPIVACAINE-NACL 0.5-0.125-0.9 MG/250ML-% EP SOLN
EPIDURAL | Status: AC
  Filled 2022-02-17: qty 250

## 2022-02-17 MED ORDER — ONDANSETRON HCL 4 MG PO TABS
4.0000 mg | ORAL_TABLET | ORAL | Status: DC | PRN
Start: 2022-02-17 — End: 2022-02-18

## 2022-02-17 MED ORDER — DIBUCAINE (PERIANAL) 1 % EX OINT
1.0000 | TOPICAL_OINTMENT | CUTANEOUS | Status: DC | PRN
Start: 2022-02-17 — End: 2022-02-18

## 2022-02-17 MED ORDER — DIPHENHYDRAMINE HCL 25 MG PO CAPS
25.0000 mg | ORAL_CAPSULE | Freq: Four times a day (QID) | ORAL | Status: DC | PRN
Start: 2022-02-17 — End: 2022-02-18

## 2022-02-17 MED ORDER — FENTANYL CITRATE (PF) 100 MCG/2ML IJ SOLN
50.0000 ug | Freq: Once | INTRAMUSCULAR | Status: AC
  Administered 2022-02-17: 50 ug via INTRAVENOUS
  Filled 2022-02-17: qty 2

## 2022-02-17 MED ORDER — TETANUS-DIPHTH-ACELL PERTUSSIS 5-2.5-18.5 LF-MCG/0.5 IM SUSY
0.5000 mL | PREFILLED_SYRINGE | Freq: Once | INTRAMUSCULAR | Status: DC
  Filled 2022-02-17: qty 0.5

## 2022-02-17 MED ORDER — ACETAMINOPHEN 325 MG PO TABS
650.0000 mg | ORAL_TABLET | ORAL | Status: DC | PRN
  Administered 2022-02-17 – 2022-02-18 (×2): 650 mg via ORAL
  Filled 2022-02-17 (×2): qty 2

## 2022-02-17 MED ORDER — PRENATAL MULTIVITAMIN CH
1.0000 | ORAL_TABLET | Freq: Every day | ORAL | Status: DC
  Administered 2022-02-17 – 2022-02-18 (×2): 1 via ORAL
  Filled 2022-02-17 (×2): qty 1

## 2022-02-17 MED ORDER — OXYCODONE HCL 5 MG PO TABS: 5.0000 mg | ORAL_TABLET | ORAL | Status: DC | PRN

## 2022-02-17 MED ORDER — OXYCODONE HCL 5 MG PO TABS: 10.0000 mg | ORAL_TABLET | ORAL | Status: DC | PRN

## 2022-02-17 MED ORDER — EPHEDRINE 5 MG/ML INJ
10.0000 mg | INTRAVENOUS | Status: DC | PRN
  Filled 2022-02-17: qty 2

## 2022-02-17 MED ORDER — LIDOCAINE HCL (PF) 1 % IJ SOLN
INTRAMUSCULAR | Status: DC | PRN
  Administered 2022-02-17: 3 mL via SUBCUTANEOUS

## 2022-02-17 MED ORDER — LIDOCAINE-EPINEPHRINE (PF) 1.5 %-1:200000 IJ SOLN
INTRAMUSCULAR | Status: DC | PRN
  Administered 2022-02-17: 3 mL via EPIDURAL

## 2022-02-17 MED ORDER — PHENYLEPHRINE 40 MCG/ML (10ML) SYRINGE FOR IV PUSH (FOR BLOOD PRESSURE SUPPORT)
80.0000 ug | PREFILLED_SYRINGE | INTRAVENOUS | Status: DC | PRN
  Filled 2022-02-17: qty 10

## 2022-02-17 MED ORDER — DOCUSATE SODIUM 100 MG PO CAPS
100.0000 mg | ORAL_CAPSULE | Freq: Two times a day (BID) | ORAL | Status: DC
  Administered 2022-02-18: 100 mg via ORAL
  Filled 2022-02-17: qty 1

## 2022-02-17 NOTE — Lactation Note (Signed)
This note was copied from a baby's chart. ?Lactation Consultation Note ? ?Patient Name: Kristen Lawrence ?Today's Date: 02/17/2022 ?Reason for consult: Initial assessment;Term ?Age:27 hours ? ?Initial Lactation visit w/ P3, SVD 9 hours ago. ? ?Maternal Data ?Does the patient have breastfeeding experience prior to this delivery?: Yes ?How long did the patient breastfeed?: 1 month, 6 months ? ?Feeding ?Mother's Current Feeding Choice: Breast Milk and Formula ? ?Mom opts for breast and bottle; same feeding preference that she had for the other children. ?She voices that she did not have feeding concerns (breastfeeding) with her other children and that baby did well this morning. ? ?LATCH Score ?Latch: Too sleepy or reluctant, no latch achieved, no sucking elicited. ? ?Audible Swallowing: None ? ?Type of Nipple: Everted at rest and after stimulation ? ?Comfort (Breast/Nipple): Soft / non-tender (short/slightly flat) ? ?Hold (Positioning): Assistance needed to correctly position infant at breast and maintain latch. (attempted to reposition baby; mom involved) ? ?LATCH Score: 5 ?Mom attempting a breastfeeding session w/ RN at bedside. Baby was crying, irritable with movement/touch, and pushing away from the breast. After a couple of attempts baby was moved to chest and was resting quietly. ? ?Lactation Tools Discussed/Used ?  ? ?Interventions ?Interventions: Breast feeding basics reviewed;Adjust position;Education ? ?Lc reviewed with mom normal newborn feeding patterns and behaviors. Discussed how breastfeeding can change from first feeding through the subsequent feedings in first 24 hours. Discussed importance of frequent attempts, benefits of skin to skin, and hand expression for stimulation. ?We did review the impact that formula/bottle feeding can have on the establishment of breastfeeding. ? ?Discharge ?  ? ?Consult Status ?Consult Status: PRN ? ? ? ?Danford Bad ?02/17/2022, 4:14 PM ? ? ? ?

## 2022-02-17 NOTE — Anesthesia Procedure Notes (Signed)
Epidural ?Patient location during procedure: OB ?Start time: 02/17/2022 5:31 AM ?End time: 02/17/2022 5:52 AM ? ?Staffing ?Anesthesiologist: Arita Miss, MD ?Performed: anesthesiologist  ? ?Preanesthetic Checklist ?Completed: patient identified, IV checked, site marked, risks and benefits discussed, surgical consent, monitors and equipment checked, pre-op evaluation and timeout performed ? ?Epidural ?Patient position: sitting ?Prep: ChloraPrep and site prepped and draped ?Patient monitoring: heart rate, continuous pulse ox and blood pressure ?Approach: midline ?Location: L3-L4 ?Injection technique: LOR saline ? ?Needle:  ?Needle type: Tuohy  ?Needle gauge: 17 G ?Needle length: 9 cm ?Needle insertion depth: 5 cm ?Catheter type: closed end flexible ?Catheter size: 19 Gauge ?Catheter at skin depth: 10 cm ?Test dose: negative and 1.5% lidocaine with Epi 1:200 K ? ?Assessment ?Sensory level: T10 ?Events: blood not aspirated, injection not painful, no injection resistance, no paresthesia and negative IV test ? ?Additional Notes ?first attempt ?Pt. Evaluated and documentation done after procedure finished. ?Patient identified. Risks/Benefits/Options discussed with patient including but not limited to bleeding, infection, nerve damage, paralysis, failed block, incomplete pain control, headache, blood pressure changes, nausea, vomiting, reactions to medication both or allergic, itching and postpartum back pain. Confirmed with bedside nurse the patient's most recent platelet count. Confirmed with patient that they are not currently taking any anticoagulation, have any bleeding history or any family history of bleeding disorders. Patient expressed understanding and wished to proceed. All questions were answered. Sterile technique was used throughout the entire procedure. Please see nursing notes for vital signs. Test dose was given through epidural catheter and negative prior to continuing to dose epidural or start infusion.  Warning signs of high block given to the patient including shortness of breath, tingling/numbness in hands, complete motor block, or any concerning symptoms with instructions to call for help. Patient was given instructions on fall risk and not to get out of bed. All questions and concerns addressed with instructions to call with any issues or inadequate analgesia.   ?  ?Patient tolerated the insertion well without immediate complications.  ?Reason for block: procedure for painReason for block:procedure for pain ? ? ? ?

## 2022-02-17 NOTE — Discharge Summary (Signed)
Obstetrical Discharge Summary ? ?Patient Name: Kristen Lawrence ?DOB: Apr 23, 1996 ?MRN: [id number] ? ?Date of Admission: 02/16/2022 ?Date of Delivery: 02/17/22 ?Delivered by: Casper Harrison CNM ?Date of Discharge: 02/18/2022 ? ?Primary OB: Princella Ion  ?LMP:No LMP recorded. ?EDC Estimated Date of Delivery: 02/09/22 ?Gestational Age at Delivery: [redacted]w[redacted]d  ? 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 ?Admitting Diagnosis: IOL for postdates ? ?Secondary Diagnosis: ?Patient Active Problem List  ? Diagnosis Date Noted  ? NSVD (normal spontaneous vaginal delivery) 02/17/2022  ? ? ?Augmentation: AROM, Pitocin, and Cytotec ?Complications: None ? ?Intrapartum complications/course:  ?Delivery Type: spontaneous vaginal delivery ?Anesthesia: epidural ?Placenta: spontaneous ?Laceration: 2nd degree vaginal and perineal ?Episiotomy: none ?Newborn Data: ?Live born female  ?Birth Weight:  7lbs 11.1oz, 3490g ?APGAR: 8, 9 ? ?Newborn Delivery   ?Birth date/time: 02/17/2022 06:46:00 ?Delivery type: Vaginal, Spontaneous ?  ?  ? ?26 y.o. G3P2002 at [redacted]w[redacted]d presenting for IOL, AROM with clear fluid.  She progressed to complete and pushed over an intact perineum and delivered the fetal head, followed promptly by the shoulders. She was in control the whole time, and the baby placed on the maternal abdomen. Delayed cord clamping and the FOB cut the baby's cord, while the baby was skin to skin. The placenta delivered spontaneously and intact. 2nd degree laceration noted and repaired. Mom and baby tolerated the procedure well.  ? ?Postpartum Procedures: none ? ?Post partum course:  ?Patient had an uncomplicated postpartum course.  By time of discharge on PPD#1, her pain was controlled on oral pain medications; she had appropriate lochia and was ambulating, voiding without difficulty and tolerating regular diet.  She was deemed stable for discharge to  home.   ? ?Discharge Physical Exam:  ?BP 112/61 (BP Location: Right Arm)   Pulse 64   Temp 98.4 ?F (36.9 ?C) (Oral)   Resp 18   Ht 5\' 3"  (1.6 m)   Wt 100.7 kg   SpO2 99%   Breastfeeding Unknown   BMI 39.33 kg/m?  ? ?General: alert and no distress ?Pulm: normal respiratory effort ?Lochia: appropriate ?Abdomen: soft, NT ?Uterine Fundus: firm, below umbilicus ?Perineum: minimal edema, intact  ?Extremities: No evidence of DVT seen on physical exam. No lower extremity edema. ?Edinburgh:  ?Kristen Lawrence Postnatal Depression Scale Screening Tool 02/18/2022  ?I have been able to laugh and see the funny side of things. 0  ?I have looked forward with enjoyment to things. 0  ?I have blamed myself unnecessarily when things went wrong. 0  ?I have been anxious or worried for no good reason. 0  ?I have felt scared or panicky for no good reason. 0  ?Things have been getting on top of me. 1  ?I have been so unhappy that I have had difficulty sleeping. 0  ?I have felt sad or miserable. 0  ?I have been so unhappy that I have been crying. 0  ?The thought of harming myself has occurred to me. 0  ?Edinburgh Postnatal Depression Scale Total 1  ?  ? ?Labs: ?CBC Latest Ref Rng & Units 02/18/2022 02/16/2022 05/16/2020  ?WBC 4.0 - 10.5 K/uL 11.1(H) 9.8 10.4  ?Hemoglobin 12.0 - 15.0 g/dL 10.8(L) 13.3 13.3  ?Hematocrit 36.0 - 46.0 % 32.7(L) 39.7 38.4  ?Platelets 150 - 400 K/uL 160 175 201  ? ?O NEG ?Hemoglobin  ?Date Value Ref Range Status  ?02/18/2022 10.8 (L) 12.0 - 15.0 g/dL Final  ? ?HCT  ?  Date Value Ref Range Status  ?02/18/2022 32.7 (L) 36.0 - 46.0 % Final  ? ? ?Disposition: stable, discharge to home ?Baby Feeding: breastmilk ?Baby Disposition: home with mom ? ?Contraception: paragard IUD ? ?Prenatal Labs:  ?Blood type/Rh O neg  ?Antibody screen neg  ?Rubella Non Immune  ?Varicella Immune  ?RPR NR  ?HBsAg Neg  ?HIV NR  ?GC neg  ?Chlamydia neg  ?Genetic screening cfDNA negative  ?1 hour GTT 99  ?3 hour GTT N/A  ?GBS neg  ? ?Rh Immune  globulin given: Rhogam needed ?Rubella vaccine given: Needed ?Varicella vaccine given: Immune ?Tdap vaccine given in AP or PP setting: given 04/30/18 ?Flu vaccine given in AP or PP setting: given 09/06/21 ? ?Plan: ?Kristen Lawrence was discharged to home in good condition. ? ? ?Discharge Instructions: Per After Visit Summary. ?Activity: Advance as tolerated. Pelvic rest for 6 weeks.   ?Diet: Regular ?Discharge Medications: ?Allergies as of 02/18/2022   ?No Known Allergies ?  ? ?  ?Medication List  ?  ? ?TAKE these medications   ? ?acetaminophen 325 MG tablet ?Commonly known as: Tylenol ?Take 2 tablets (650 mg total) by mouth every 4 (four) hours as needed for mild pain or moderate pain. ?  ?ibuprofen 600 MG tablet ?Commonly known as: ADVIL ?Take 1 tablet (600 mg total) by mouth every 6 (six) hours as needed for mild pain or cramping. ?What changed: reasons to take this ?  ?prenatal multivitamin Tabs tablet ?Take 1 tablet by mouth daily at 12 noon. ?  ? ?  ? ?Outpatient follow up:  ? Follow-up Information   ? ? [place] DEPT. Schedule an appointment as soon as possible for a visit in 6 week(s).   ?Why: postpartum visit and IUD placement ?Contact information: ?[facility] SSN-[id number] ?[id number] ? ?  ?  ? ?  ?  ? ?  ? ? ?Signed: ? ?Drinda Butts, CNM ?Certified Nurse Midwife ?[facility] ?[facility]   ?

## 2022-02-17 NOTE — Anesthesia Preprocedure Evaluation (Signed)
Anesthesia Evaluation  ?Patient identified by MRN, date of birth, ID band ?Patient awake ? ? ? ?Reviewed: ?Allergy & Precautions, NPO status , Patient's Chart, lab work & pertinent test results ? ?History of Anesthesia Complications ?Negative for: history of anesthetic complications ? ?Airway ?Mallampati: III ? ?TM Distance: >3 FB ?Neck ROM: Full ? ? ? Dental ?no notable dental hx. ?(+) Teeth Intact ?  ?Pulmonary ?neg pulmonary ROS, neg sleep apnea, neg COPD, Patient abstained from smoking.Not current smoker,  ?  ?Pulmonary exam normal ?breath sounds clear to auscultation ? ? ? ? ? ? Cardiovascular ?Exercise Tolerance: Good ?METS(-) hypertension(-) CAD and (-) Past MI negative cardio ROS ? ?(-) dysrhythmias  ?Rhythm:Regular Rate:Normal ?- Systolic murmurs ? ?  ?Neuro/Psych ?negative neurological ROS ? negative psych ROS  ? GI/Hepatic ?neg GERD  ,(+)  ?  ? (-) substance abuse ? ,   ?Endo/Other  ?neg diabetes ? Renal/GU ?negative Renal ROS  ? ?  ?Musculoskeletal ? ? Abdominal ?  ?Peds ? Hematology ?  ?Anesthesia Other Findings ?Past Medical History: ?No date: Medical history non-contributory ? Reproductive/Obstetrics ?(+) Pregnancy ? ?  ? ? ? ? ? ? ? ? ? ? ? ? ? ?  ?  ? ? ? ? ? ? ? ? ?Anesthesia Physical ?Anesthesia Plan ? ?ASA: 2 ? ?Anesthesia Plan: Epidural  ? ?Post-op Pain Management:   ? ?Induction:  ? ?PONV Risk Score and Plan: 2 and Treatment may vary due to age or medical condition and Ondansetron ? ?Airway Management Planned: Natural Airway ? ?Additional Equipment:  ? ?Intra-op Plan:  ? ?Post-operative Plan:  ? ?Informed Consent: I have reviewed the patients History and Physical, chart, labs and discussed the procedure including the risks, benefits and alternatives for the proposed anesthesia with the patient or authorized representative who has indicated his/her understanding and acceptance.  ? ? ? ?Interpreter used for interveiw ? ?Plan Discussed with: Surgeon ? ?Anesthesia  Plan Comments: (Discussed R/B/A of neuraxial anesthesia technique with patient: ?- rare risks of spinal/epidural hematoma, nerve damage, infection ?- Risk of PDPH ?- Risk of itching ?- Risk of nausea and vomiting ?- Risk of poor block necessitating replacement of epidural. ?- Risk of allergic reactions. ?Patient voiced understanding.)  ? ? ? ? ? ? ?Anesthesia Quick Evaluation ? ?

## 2022-02-17 NOTE — Progress Notes (Signed)
Labor Progress Note ? ?Kristen Lawrence is a 26 y.o. G3P2002 at [redacted]w[redacted]d by LMP admitted for induction of labor due to Post dates. Due date 02/09/22. ? ?Subjective: Pt is reporting lots of pain with her UCs.  She is managing very well through each.  ? ?Objective: ?BP 125/70 (BP Location: Left Arm)   Pulse (!) 59   Temp 97.9 ?F (36.6 ?C) (Axillary)   Resp 18   Ht 5\' 3"  (1.6 m)   Wt 100.7 kg   BMI 39.33 kg/m?  ? ?Fetal Assessment: ?FHT:  FHR: 125 bpm, variability: moderate,  accelerations:  Present,  decelerations:  Absent ?Category/reactivity:  Category I ?UC:   regular, every 2-3 minutes ?SVE:    ?Dilation: 4cm  ?Effacement: 80%  ?Station:  -2  ?Consistency: soft  ?Position: anterior  ?Membrane status:Intact ?Amniotic color: n/a ? ?Labs: ?Lab Results  ?Component Value Date  ? WBC 9.8 02/16/2022  ? HGB 13.3 02/16/2022  ? HCT 39.7 02/16/2022  ? MCV 92.1 02/16/2022  ? PLT 175 02/16/2022  ? ? ?Assessment / Plan: ?Induction of labor due to postterm,  progressing well on pitocin ?0609 Cytotec given ?1026 Cytotec given ?1445 Cytotec given ?1947 Pitocin started ?Currently on 60mU ? ?Labor: Progressing normally ?Preeclampsia:   125/70 ?Fetal Wellbeing:  Category I ?Pain Control:  Labor support without medications ?I/D:   Afebrile, GBS neg, Intact ?Anticipated MOD:  NSVD ? ?Clydene Laming, CNM ?02/17/2022, 1:24 AM ? ? ? ? ? ? ?  ?

## 2022-02-18 LAB — CBC
HCT: 32.7 % — ABNORMAL LOW (ref 36.0–46.0)
Hemoglobin: 10.8 g/dL — ABNORMAL LOW (ref 12.0–15.0)
MCH: 30.4 pg (ref 26.0–34.0)
MCHC: 33 g/dL (ref 30.0–36.0)
MCV: 92.1 fL (ref 80.0–100.0)
Platelets: 160 10*3/uL (ref 150–400)
RBC: 3.55 MIL/uL — ABNORMAL LOW (ref 3.87–5.11)
RDW: 13.6 % (ref 11.5–15.5)
WBC: 11.1 10*3/uL — ABNORMAL HIGH (ref 4.0–10.5)
nRBC: 0 % (ref 0.0–0.2)

## 2022-02-18 LAB — FETAL SCREEN: Fetal Screen: NEGATIVE

## 2022-02-18 MED ORDER — IBUPROFEN 600 MG PO TABS
600.0000 mg | ORAL_TABLET | Freq: Four times a day (QID) | ORAL | 0 refills | Status: AC | PRN
Start: 2022-02-18 — End: ?

## 2022-02-18 MED ORDER — RHO D IMMUNE GLOBULIN 1500 UNIT/2ML IJ SOSY
300.0000 ug | PREFILLED_SYRINGE | Freq: Once | INTRAMUSCULAR | Status: AC
  Administered 2022-02-18: 300 ug via INTRAMUSCULAR
  Filled 2022-02-18: qty 2

## 2022-02-18 NOTE — Progress Notes (Signed)
Mother discharged.  Discharge instructions give via interpreter Rafeal [phone]  Mother verbalizes understanding.  Transported by auxiliary.  ?

## 2022-02-18 NOTE — Anesthesia Postprocedure Evaluation (Signed)
Anesthesia Post Note ? ?Patient: Kristen Lawrence ? ?Procedure(s) Performed: AN AD HOC LABOR EPIDURAL ? ?Patient location during evaluation: Mother Baby ?Anesthesia Type: Epidural ?Level of consciousness: awake ?Pain management: pain level controlled ?Respiratory status: spontaneous breathing ?Cardiovascular status: stable ?Postop Assessment: no headache ?Anesthetic complications: no ? ? ?No notable events documented. ? ? ?Last Vitals:  ?Vitals:  ? 02/17/22 1930 02/18/22 0015  ?BP: 112/60 102/68  ?Pulse: 67   ?Resp: 18 16  ?Temp: 36.9 ?C 36.8 ?C  ?SpO2:    ?  ?Last Pain:  ?Vitals:  ? 02/18/22 0015  ?TempSrc: Oral  ?PainSc: 4   ? ? ?  ?  ?  ?  ?  ?  ? ?Lerry Liner ? ? ? ? ?

## 2022-02-18 NOTE — Discharge Instructions (Signed)

## 2022-02-19 LAB — RHOGAM INJECTION: Unit division: 0

## 2022-04-28 ENCOUNTER — Emergency Department
Admission: EM | Admit: 2022-04-28 | Discharge: 2022-04-29 | Disposition: A | Discharge status: Home / Self Care | Payer: Self-pay | Attending: Emergency Medicine | Admitting: Emergency Medicine

## 2022-04-28 ENCOUNTER — Emergency Department: Payer: Self-pay

## 2022-04-28 ENCOUNTER — Encounter: Payer: Self-pay | Admitting: Emergency Medicine

## 2022-04-28 DIAGNOSIS — K30 Functional dyspepsia: Secondary | ICD-10-CM | POA: Insufficient documentation

## 2022-04-28 DIAGNOSIS — R0789 Other chest pain: Secondary | ICD-10-CM

## 2022-04-28 LAB — CBC
HCT: 37.4 % (ref 36.0–46.0)
Hemoglobin: 12.6 g/dL (ref 12.0–15.0)
MCH: 31.2 pg (ref 26.0–34.0)
MCHC: 33.7 g/dL (ref 30.0–36.0)
MCV: 92.6 fL (ref 80.0–100.0)
Platelets: 200 10*3/uL (ref 150–400)
RBC: 4.04 MIL/uL (ref 3.87–5.11)
RDW: 12.6 % (ref 11.5–15.5)
WBC: 9.6 10*3/uL (ref 4.0–10.5)
nRBC: 0 % (ref 0.0–0.2)

## 2022-04-28 MED ORDER — ACETAMINOPHEN 500 MG PO TABS
1000.0000 mg | ORAL_TABLET | Freq: Once | ORAL | Status: AC
  Administered 2022-04-29: 1000 mg via ORAL
  Filled 2022-04-28: qty 2

## 2022-04-28 MED ORDER — ONDANSETRON HCL 4 MG/2ML IJ SOLN
4.0000 mg | Freq: Once | INTRAMUSCULAR | Status: AC
  Administered 2022-04-29: 4 mg via INTRAVENOUS
  Filled 2022-04-28: qty 2

## 2022-04-28 MED ORDER — FAMOTIDINE IN NACL 20-0.9 MG/50ML-% IV SOLN
20.0000 mg | Freq: Once | INTRAVENOUS | Status: AC
  Administered 2022-04-29: 20 mg via INTRAVENOUS
  Filled 2022-04-28: qty 50

## 2022-04-28 NOTE — ED Triage Notes (Signed)
Pt c/o left rib pain that started 30 min prior to arrival with sharp continuous pain, worse with deep breathing. Pt 2 months post-partum with no reported complications. Pt denies N/V/D.

## 2022-04-29 LAB — BASIC METABOLIC PANEL
Anion gap: 9 (ref 5–15)
BUN: 13 mg/dL (ref 6–20)
CO2: 24 mmol/L (ref 22–32)
Calcium: 9 mg/dL (ref 8.9–10.3)
Chloride: 106 mmol/L (ref 98–111)
Creatinine, Ser: 0.64 mg/dL (ref 0.44–1.00)
GFR, Estimated: >60 mL/min (ref >60)
Glucose, Bld: 100 mg/dL — ABNORMAL HIGH (ref 70–99)
Potassium: 3.6 mmol/L (ref 3.5–5.1)
Sodium: 139 mmol/L (ref 135–145)

## 2022-04-29 LAB — TROPONIN I (HIGH SENSITIVITY)
Troponin I (High Sensitivity): <2 ng/L (ref <18)
Troponin I (High Sensitivity): <2 ng/L (ref <18)

## 2022-04-29 LAB — HCG, QUANTITATIVE, PREGNANCY: hCG, Beta Chain, Quant, S: <1 m[IU]/mL (ref <5)

## 2022-04-29 LAB — HEPATIC FUNCTION PANEL
ALT: 34 U/L (ref 0–44)
AST: 34 U/L (ref 15–41)
Albumin: 4 g/dL (ref 3.5–5.0)
Alkaline Phosphatase: 69 U/L (ref 38–126)
Bilirubin, Direct: 0.1 mg/dL (ref 0.0–0.2)
Indirect Bilirubin: 0.6 mg/dL (ref 0.3–0.9)
Total Bilirubin: 0.7 mg/dL (ref 0.3–1.2)
Total Protein: 7.3 g/dL (ref 6.5–8.1)

## 2022-04-29 LAB — D-DIMER, QUANTITATIVE: D-Dimer, Quant: <0.27 ug/mL-FEU (ref 0.00–0.50)

## 2022-04-29 LAB — LIPASE, BLOOD: Lipase: 28 U/L (ref 11–51)

## 2022-04-29 NOTE — ED Provider Notes (Signed)
[facility] Provider Note    Event Date/Time   First MD Initiated Contact with Patient 04/28/22 2336     (approximate)   History   Chest Pain   HPI  Kristen Lawrence is a 26 y.o. female with no significant past medical history who presents for evaluation of left-sided lower chest upper abdominal pain that started prior to arrival.  Patient reports that pain started about 30 minutes prior to arrival while she was eating.  The pain is sharp, worse with deep inspiration.  Earlier today patient had nausea but no vomiting.  She denies shortness of breath.  Patient is 2 months postpartum with no complications from a spontaneous vaginal delivery.  No personal or family history of PE or DVT, no recent travel immobilization, no leg pain or swelling, no hemoptysis or exogenous hormones.  She denies cough or congestion.     Past Medical History:  Diagnosis Date   Medical history non-contributory     Past Surgical History:  Procedure Laterality Date   APPENDECTOMY     NO PAST SURGERIES       Physical Exam   Triage Vital Signs: ED Triage Vitals  Enc Vitals Group     BP 04/28/22 2336 129/80     Pulse Rate 04/28/22 2336 68     Resp 04/28/22 2336 19     Temp 04/28/22 2336 98.1 F (36.7 C)     Temp Source 04/28/22 2336 Oral     SpO2 04/28/22 2336 98 %     Weight 04/28/22 2338 240 lb (108.9 kg)     Height 04/28/22 2338 5\' 4"  (1.626 m)     Head Circumference --      Peak Flow --      Pain Score 04/29/22 0105 Asleep     Pain Loc --      Pain Edu? --      Excl. in [place]? --     Most recent vital signs: Vitals:   04/29/22 0100 04/29/22 0200  BP: 124/78 117/72  Pulse: (!) 55 60  Resp: 18 18  Temp:    SpO2: 99% 100%     Constitutional: Alert and oriented. Well appearing and in no apparent distress. HEENT:      Head: Normocephalic and atraumatic.         Eyes: Conjunctivae are normal. Sclera is non-icteric.       Mouth/Throat: Mucous  membranes are moist.       Neck: Supple with no signs of meningismus. Cardiovascular: Regular rate and rhythm. No murmurs, gallops, or rubs. 2+ symmetrical distal pulses are present in all extremities.  Respiratory: Normal respiratory effort. Lungs are clear to auscultation bilaterally.  Gastrointestinal: Soft, mild tenderness to palpation of the LUQ, and non distended with positive bowel sounds. No rebound or guarding. Genitourinary: No CVA tenderness. Musculoskeletal:  No edema, cyanosis, or erythema of extremities. Neurologic: Normal speech and language. Face is symmetric. Moving all extremities. No gross focal neurologic deficits are appreciated. Skin: Skin is warm, dry and intact. No rash noted. Psychiatric: Mood and affect are normal. Speech and behavior are normal.  ED Results / Procedures / Treatments   Labs (all labs ordered are listed, but only abnormal results are displayed) Labs Reviewed  BASIC METABOLIC PANEL - Abnormal; Notable for the following components:      Result Value   Glucose, Bld 100 (*)    All other components within normal limits  CBC  HCG, QUANTITATIVE, PREGNANCY  HEPATIC FUNCTION PANEL  LIPASE, BLOOD  D-DIMER, QUANTITATIVE (NOT AT [facility])  TROPONIN I (HIGH SENSITIVITY)  TROPONIN I (HIGH SENSITIVITY)     EKG  ED ECG REPORT I, Rudene Re, the attending physician, personally viewed and interpreted this ECG.  Sinus rhythm with a rate of 64, normal intervals, normal axis, no ST elevations or depressions.  RADIOLOGY I, Rudene Re, attending MD, have personally viewed and interpreted the images obtained during this visit as below:  Chest x-ray with no signs of pneumonia, CHF, pneumothorax   ___________________________________________________ Interpretation by Radiologist:  DG Chest Portable 1 View  Result Date: 04/29/2022 CLINICAL DATA:  Chest pain EXAM: PORTABLE CHEST 1 VIEW COMPARISON:  05/16/2020 FINDINGS: The heart size and  mediastinal contours are within normal limits. Both lungs are clear. The visualized skeletal structures are unremarkable. IMPRESSION: No active disease. Electronically Signed   By: Rolm Baptise M.D.   On: 04/29/2022 00:21       PROCEDURES:  Critical Care performed: No  Procedures    IMPRESSION / MDM / [place] / ED COURSE  I reviewed the triage vital signs and the nursing notes.  26 y.o. female with no significant past medical history who presents for evaluation of left-sided lower chest upper abdominal pain that started prior to arrival.  Patient with sudden onset of sharp left lower chest/upper abdominal pain that started while eating.  On exam is extremely well-appearing in no distress with normal vital signs, normal work of breathing normal sats, abdomen is soft with mild left upper quadrant tenderness, no rebound or guarding  Ddx: Indigestion, ulcer, pancreatitis, esophageal spasm, pneumonia, pneumothorax, pleurisy, costochondritis.  Sounds atypical for ACS.  No tachypnea, tachycardia, or hypoxia but the fact the patient has an elevated BMI and is 2 months postpartum, slightly increased risk for PE   Plan: EKG, troponin x2, D-dimer, CBC, CMP, lipase, chest x-ray.  Patient placed on telemetry for monitoring of cardiorespiratory status.  We will treat with IV Pepcid, Tylenol and Zofran.   MEDICATIONS GIVEN IN ED: Medications  famotidine (PEPCID) IVPB 20 mg premix (0 mg Intravenous Stopped 04/29/22 0211)  acetaminophen (TYLENOL) tablet 1,000 mg (1,000 mg Oral Given 04/29/22 0017)  ondansetron (ZOFRAN) injection 4 mg (4 mg Intravenous Given 04/29/22 0016)     ED COURSE: EKG with no acute ischemic changes.  2 high-sensitivity troponins were negative.  D-dimer negative.  No signs of anemia, AKI, normal LFTs and lipase, chest x-ray negative for any acute pathology.  After IV Pepcid, Tylenol and Zofran patient's symptoms fully resolved.  She remains with no pain or tenderness to  palpation.  At this time presentation is most likely concerning for indigestion.  Discussed follow-up with primary care doctor recommended return to the hospital if pain recurs.  With negative work-up and symptoms that have now resolved I feel the patient safe for discharge home.   Consults: none   EMR reviewed including records from her last admission to the hospital from 2 months ago for labor and delivery    FINAL CLINICAL IMPRESSION(S) / ED DIAGNOSES   Final diagnoses:  Atypical chest pain  Indigestion     Rx / DC Orders   ED Discharge Orders     None        Note:  This document was prepared using Dragon voice recognition software and may include unintentional dictation errors.   Please note:  Patient was evaluated in Emergency Department today for the symptoms described in the history of present illness.  Patient was evaluated in the context of the global COVID-19 pandemic, which necessitated consideration that the patient might be at risk for infection with the SARS-CoV-2 virus that causes COVID-19. Institutional protocols and algorithms that pertain to the evaluation of patients at risk for COVID-19 are in a state of rapid change based on information released by regulatory bodies including the CDC and federal and state organizations. These policies and algorithms were followed during the patient's care in the ED.  Some ED evaluations and interventions may be delayed as a result of limited staffing during the pandemic.       Alfred Levins, [place], MD 04/29/22 [phone]

## 2022-04-29 NOTE — ED Notes (Signed)
Pt DC to home. DC instructions reviewed with all questions answered. Pt verbalizes understanding. IV removed, cath intact, pressure dressing applied. No bleeding noted. Pt left dept wheelchair with family member

## 2022-11-28 ENCOUNTER — Encounter: Payer: Self-pay | Admitting: Emergency Medicine

## 2022-11-28 ENCOUNTER — Emergency Department
Admission: EM | Admit: 2022-11-28 | Discharge: 2022-11-28 | Disposition: A | Discharge status: Home / Self Care | Payer: Self-pay | Attending: Emergency Medicine | Admitting: Emergency Medicine

## 2022-11-28 ENCOUNTER — Other Ambulatory Visit: Payer: Self-pay

## 2022-11-28 ENCOUNTER — Emergency Department: Payer: Self-pay

## 2022-11-28 DIAGNOSIS — F32A Depression, unspecified: Secondary | ICD-10-CM | POA: Insufficient documentation

## 2022-11-28 DIAGNOSIS — M25422 Effusion, left elbow: Secondary | ICD-10-CM | POA: Insufficient documentation

## 2022-11-28 NOTE — Discharge Instructions (Addendum)
Yo may use the ace wrap for extra support to your elbow. Follow up with orthopedics if your symptoms persist.  You may also call the phone number provided to help with depression.  Please return the emergency department if you develop suicidal or homicidal thoughts.  It was a pleasure caring for you today.
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
## 2022-11-28 NOTE — ED Triage Notes (Signed)
Pt comes with c/o elbow pain that started 9pm last night. Pt denies any recent injuries.

## 2023-09-25 ENCOUNTER — Other Ambulatory Visit: Payer: Self-pay

## 2023-09-25 DIAGNOSIS — B379 Candidiasis, unspecified: Secondary | ICD-10-CM | POA: Insufficient documentation

## 2023-09-25 LAB — WET PREP, GENITAL
Clue Cells Wet Prep HPF POC: NONE SEEN
Sperm: NONE SEEN
Trich, Wet Prep: NONE SEEN
WBC, Wet Prep HPF POC: <10 — AB (ref <10)

## 2023-09-25 LAB — URINALYSIS, ROUTINE W REFLEX MICROSCOPIC
Bilirubin Urine: NEGATIVE
Glucose, UA: NEGATIVE mg/dL
Hgb urine dipstick: NEGATIVE
Ketones, ur: NEGATIVE mg/dL
Nitrite: NEGATIVE
Protein, ur: NEGATIVE mg/dL
Specific Gravity, Urine: 1.032 — ABNORMAL HIGH (ref 1.005–1.030)
pH: 5 (ref 5.0–8.0)

## 2023-09-25 LAB — POC URINE PREG, ED: Preg Test, Ur: NEGATIVE

## 2023-09-25 NOTE — ED Triage Notes (Signed)
Pt presents with vaginal itching and burning that started after having intercourse 2 days ago. Today pt started having some vaginal discharge.

## 2023-09-26 ENCOUNTER — Emergency Department
Admission: EM | Admit: 2023-09-26 | Discharge: 2023-09-26 | Disposition: A | Discharge status: Home / Self Care | Payer: Self-pay | Attending: Emergency Medicine | Admitting: Emergency Medicine

## 2023-09-26 DIAGNOSIS — B379 Candidiasis, unspecified: Secondary | ICD-10-CM

## 2023-09-26 LAB — CHLAMYDIA/NGC RT PCR (ARMC ONLY)
Chlamydia Tr: NOT DETECTED
N gonorrhoeae: NOT DETECTED

## 2023-09-26 MED ORDER — FLUCONAZOLE 50 MG PO TABS
150.0000 mg | ORAL_TABLET | Freq: Once | ORAL | Status: AC
  Administered 2023-09-26: 150 mg via ORAL
  Filled 2023-09-26: qty 1

## 2023-09-26 NOTE — Discharge Instructions (Addendum)
Only 1 dose of the medication which was given to you here today as required at this time.  Please follow-up with your primary care provider as needed.  No other infections noted.

## 2023-09-26 NOTE — ED Provider Notes (Signed)
[facility] Provider Note    Event Date/Time   First MD Initiated Contact with Patient 09/26/23 [phone]     (approximate)   History   Vaginal Itching   HPI Kristen Lawrence is a 27 y.o. female presenting today for vaginal itching.  Patient states having intercourse with her boyfriend 2 days ago and has subsequently developed vaginal itching.  Has scant discharge.  No vaginal pain.  No dysuria, hematuria, abdominal pain.  No fevers or chills.     Physical Exam   Triage Vital Signs: ED Triage Vitals  Encounter Vitals Group     BP 09/25/23 2228 130/86     Systolic BP Percentile --      Diastolic BP Percentile --      Pulse Rate 09/25/23 2228 (!) 112     Resp 09/25/23 2228 20     Temp 09/25/23 2228 98.1 F (36.7 C)     Temp Source 09/25/23 2228 Oral     SpO2 --      Weight 09/25/23 2230 180 lb (81.6 kg)     Height --      Head Circumference --      Peak Flow --      Pain Score 09/25/23 2227 0     Pain Loc --      Pain Education --      Exclude from Growth Chart --     Most recent vital signs: Vitals:   09/25/23 2228  BP: 130/86  Pulse: (!) 112  Resp: 20  Temp: 98.1 F (36.7 C)   I have reviewed the vital signs. General:  Awake, alert, no acute distress. Head:  Normocephalic, Atraumatic. EENT:  PERRL, EOMI, Oral mucosa pink and moist, Neck is supple. Cardiovascular: Regular rate, 2+ distal pulses. Respiratory:  Normal respiratory effort, symmetrical expansion, no distress.   Extremities:  Moving all four extremities through full ROM without pain.   Neuro:  Alert and oriented.  Interacting appropriately.   Skin:  Warm, dry, no rash.   Psych: Appropriate affect.    ED Results / Procedures / Treatments   Labs (all labs ordered are listed, but only abnormal results are displayed) Labs Reviewed  WET PREP, GENITAL - Abnormal; Notable for the following components:      Result Value   Yeast Wet Prep HPF POC PRESENT (*)    WBC,  Wet Prep HPF POC <10 (*)    All other components within normal limits  URINALYSIS, ROUTINE W REFLEX MICROSCOPIC - Abnormal; Notable for the following components:   Color, Urine YELLOW (*)    APPearance CLOUDY (*)    Specific Gravity, Urine 1.032 (*)    Leukocytes,Ua LARGE (*)    Bacteria, UA RARE (*)    All other components within normal limits  CHLAMYDIA/NGC RT PCR ([facility] ONLY)            POC URINE PREG, ED     EKG    RADIOLOGY    PROCEDURES:  Critical Care performed: No  Procedures   MEDICATIONS ORDERED IN ED: Medications  fluconazole (DIFLUCAN) tablet 150 mg (has no administration in time range)     IMPRESSION / MDM / ASSESSMENT AND PLAN / ED COURSE  I reviewed the triage vital signs and the nursing notes.                              Differential diagnosis includes, but  is not limited to, yeast infection, BV, gonorrhea, chlamydia, less likely UTI  Patient's presentation is most consistent with acute complicated illness / injury requiring diagnostic workup.  Patient is a 27 year old female presenting today for vaginal itching and discharge.  No other symptoms here at this time.  Negative for gonorrhea, chlamydia, trichomonas, BV.  Did have yeast present indicating likely yeast infection.  UA was equivocal with a lot of squames likely representing dirty collection.  No dysuria symptoms or abdominal pain.  Will not treat at this time.  Patient was given fluconazole here and discharged with PCP follow-up.  The patient is on the cardiac monitor to evaluate for evidence of arrhythmia and/or significant heart rate changes.     FINAL CLINICAL IMPRESSION(S) / ED DIAGNOSES   Final diagnoses:  Yeast infection     Rx / DC Orders   ED Discharge Orders     None        Note:  This document was prepared using Dragon voice recognition software and may include unintentional dictation errors.   Janith Lima, MD 09/26/23 Emeline Darling

## 2024-02-10 ENCOUNTER — Other Ambulatory Visit: Payer: Self-pay

## 2024-02-10 DIAGNOSIS — K047 Periapical abscess without sinus: Secondary | ICD-10-CM | POA: Insufficient documentation

## 2024-02-10 NOTE — ED Triage Notes (Signed)
 Pt reports dental pain that began earlier tonight

## 2024-02-11 ENCOUNTER — Emergency Department
Admission: EM | Admit: 2024-02-11 | Discharge: 2024-02-11 | Disposition: A | Discharge status: Home / Self Care | Payer: Self-pay | Attending: Emergency Medicine | Admitting: Emergency Medicine

## 2024-02-11 DIAGNOSIS — K0889 Other specified disorders of teeth and supporting structures: Secondary | ICD-10-CM

## 2024-02-11 DIAGNOSIS — K047 Periapical abscess without sinus: Secondary | ICD-10-CM

## 2024-02-11 MED ORDER — OXYCODONE HCL 5 MG PO TABS
5.0000 mg | ORAL_TABLET | Freq: Once | ORAL | Status: AC
  Administered 2024-02-11: 5 mg via ORAL
  Filled 2024-02-11: qty 1

## 2024-02-11 MED ORDER — CLINDAMYCIN HCL 300 MG PO CAPS: 300.0000 mg | ORAL_CAPSULE | Freq: Three times a day (TID) | ORAL | 0 refills | Status: AC

## 2024-02-11 MED ORDER — CLINDAMYCIN HCL 150 MG PO CAPS
300.0000 mg | ORAL_CAPSULE | Freq: Once | ORAL | Status: AC
  Administered 2024-02-11: 300 mg via ORAL
  Filled 2024-02-11: qty 2

## 2024-02-11 MED ORDER — HYDROCODONE-ACETAMINOPHEN 5-325 MG PO TABS: 1.0000 | ORAL_TABLET | ORAL | 0 refills | Status: AC | PRN

## 2024-02-11 NOTE — ED Notes (Signed)
 Reports tooth pain

## 2024-02-11 NOTE — ED Provider Notes (Signed)
   [facility] Provider Note    Event Date/Time   First MD Initiated Contact with Patient 02/11/24 [phone]     (approximate)  History   Chief Complaint: Dental Pain  HPI  Kristen Lawrence is a 28 y.o. female with no significant past medical history who presents to the emergency department for right upper molar dental pain.  According to the patient around 4 PM tonight she developed acute onset of pain in the right upper molar which is led to a headache per patient.  Patient states pain in the tooth worse if she chews.  No fever.  Patient tried taking an aspirin Tylenol and ibuprofen at home as well as an amoxicillin tablet from a prior infection without any pain relief so she came to the emergency department for evaluation.  Physical Exam   Triage Vital Signs: ED Triage Vitals [02/10/24 2324]  Encounter Vitals Group     BP (!) 158/78     Systolic BP Percentile      Diastolic BP Percentile      Pulse Rate 88     Resp 20     Temp 98.8 F (37.1 C)     Temp Source Oral     SpO2 96 %     Weight      Height      Head Circumference      Peak Flow      Pain Score 10     Pain Loc      Pain Education      Exclude from Growth Chart     Most recent vital signs: Vitals:   02/10/24 2324  BP: (!) 158/78  Pulse: 88  Resp: 20  Temp: 98.8 F (37.1 C)  SpO2: 96%    General: Awake, no distress.  CV:  Good peripheral perfusion.   Resp:  Normal effort.  Abd:  No distention.  Soft, nontender. Other:  Somewhat poor dentition especially in the upper molars.  There is tenderness to palpation in the area above the right upper molars but there is no abscess or fluctuance.   ED Results / Procedures / Treatments   MEDICATIONS ORDERED IN ED: Medications  oxyCODONE (Oxy IR/ROXICODONE) immediate release tablet 5 mg (has no administration in time range)  clindamycin (CLEOCIN) capsule 300 mg (has no administration in time range)     IMPRESSION / MDM /  ASSESSMENT AND PLAN / ED COURSE  I reviewed the triage vital signs and the nursing notes.  Patient's presentation is most consistent with acute illness / injury with system symptoms.  Patient presents to the emergency department for dental pain.  Patient's symptoms are suggestive of likely dental infection.  We will treat the patient's pain as well as start the patient on clindamycin.  I discussed with the patient she needs to follow-up with a dentist will provide a list of local dental clinics.  No other concerning findings on physical exam.  No abscess.  No facial swelling erythema.  Patient is feeling better after medications.  Will discharge home with antibiotics and pain medication.  Patient will follow-up with a dentist.  FINAL CLINICAL IMPRESSION(S) / ED DIAGNOSES   Dental infection   Note:  This document was prepared using Dragon voice recognition software and may include unintentional dictation errors.   Minna Antis, MD 02/11/24 [phone]

## 2024-02-11 NOTE — Discharge Instructions (Signed)
 Please follow-up with a dentist as soon as you are able.  Return to the emergency department for any fever or any other significant symptom concerning to yourself.  Please take your antibiotic as prescribed for its entire course.  You may take your pain medication as needed but only as prescribed.  Do not drink alcohol or drive while taking this medication.   OPTIONS FOR DENTAL FOLLOW UP CARE  [place] [facility] - Local Safety Net Dental Clinics [email or web address]   [facility] [phone])  Sharl Ma [phone])  [facility] [phone])  [facility] [phone])  [facility] [phone]) This clinic caters to the indigent population and is on a lottery system. Location: [employer] [facility], [employer], [address] [place] Clinic Hours: Wednesdays from 6pm - 9pm, patients seen by a lottery system. For dates, call or go to [email or web address] Services: Cleanings, fillings and simple extractions. Payment Options: DENTAL WORK IS FREE OF CHARGE. Bring proof of income or support. Best way to get seen: Arrive at 5:15 pm - this is a lottery, NOT first come/first serve, so arriving earlier will not increase your chances of being seen.     [facility] [phone] Select option 1 for emergencies   Location: [facility], [place], [address] [place] Clinic Hours: No walk-ins accepted - call the day before to schedule an appointment. Check in times are 9:30 am and 1:30 pm. Services: Simple extractions, temporary fillings, pulpectomy/pulp debridement, uncomplicated abscess drainage. Payment Options: PAYMENT IS DUE AT THE TIME OF SERVICE.  Fee is usually $100-200, additional surgical procedures (e.g. abscess drainage) may be extra. Cash, checks,  Visa/MasterCard accepted.  Can file Medicaid if patient is covered for dental - patient should call case worker to check. No discount for [facility] patients. Best way to get seen: MUST call the day before and get onto the schedule. Can usually be seen the next 1-2 days. No walk-ins accepted.     [facility] [phone]   Location: [facility], [address], [place] Clinic Hours: M, W, Th, F 8am or 1:30pm, Tues 9a or 1:30 - first come/first served. Services: Simple extractions, temporary fillings, uncomplicated abscess drainage.  You do not need to be an [facility] resident. Payment Options: PAYMENT IS DUE AT THE TIME OF SERVICE. Dental insurance, otherwise sliding scale - bring proof of income or support. Depending on income and treatment needed, cost is usually $50-200. Best way to get seen: Arrive early as it is first come/first served.     [facility] [phone]   Location: [address] Clinic Hours: Mon-Thu 8a-5p Services: Most basic dental services including extractions and fillings. Payment Options: PAYMENT IS DUE AT THE TIME OF SERVICE. Sliding scale, up to 50% off - bring proof if income or support. Medicaid with dental option accepted. Best way to get seen: Call to schedule an appointment, can usually be seen within 2 weeks OR they will try to see walk-ins - show up at 8a or 2p (you may have to wait).     [facility] [phone] [place] RESIDENTS ONLY   Location: [facility], [address] [place], [place] [address] Clinic Hours: By appointment only. Monday - Thursday 8am-5pm, Friday 8am-12pm Services: Cleanings, fillings, extractions. Payment Options: PAYMENT IS DUE AT THE TIME OF SERVICE. Cash, [place] or  MasterCard. Sliding scale - $30 minimum per service. Best way to get seen: Come in to office, complete packet and  make an appointment - need proof of income or support monies for each household member and proof of [facility] residence. Usually takes about a month to get in.     [facility] [phone]   Location: [address], [facility] Clinic Hours: Walk-in Urgent Care Dental Services are offered Monday-Friday mornings only. The numbers of emergencies accepted daily is limited to the number of providers available. Maximum 15 - Mondays, Wednesdays & Thursdays Maximum 10 - Tuesdays & Fridays Services: You do not need to be a [facility] resident to be seen for a dental emergency. Emergencies are defined as pain, swelling, abnormal bleeding, or dental trauma. Walkins will receive x-rays if needed. NOTE: Dental cleaning is not an emergency. Payment Options: PAYMENT IS DUE AT THE TIME OF SERVICE. Minimum co-pay is $40.00 for uninsured patients. Minimum co-pay is $3.00 for Medicaid with dental coverage. Dental Insurance is accepted and must be presented at time of visit. Medicare does not cover dental. Forms of payment: Cash, credit card, checks. Best way to get seen: If not previously registered with the clinic, walk-in dental registration begins at 7:15 am and is on a first come/first serve basis. If previously registered with the clinic, call to make an appointment.     [facility] [phone] [place] RESIDENTS ONLY   Location: [address] [place], [place] Clinic Hours: Mon-Thu 10a-2p Services: Extractions only! Payment Options: FREE (donations accepted) - bring proof of income or support Best way to get seen: Call and schedule an appointment OR come at 8am on the 1st Monday of every month (except for holidays) when it is first come/first served.     [facility] [phone]   Location: [address] [place], [place] Clinic Hours: Friday mornings Services, Payment Options, Best way to get seen: Call for info
